# Patient Record
Sex: Female | Born: 1960 | Race: White | Hispanic: No | Smoking: Never smoker
Health system: Southern US, Community
[De-identification: ages and names within clinical notes are randomized; demographics above are authoritative.]

## PROBLEM LIST (undated history)

## (undated) DIAGNOSIS — K219 Gastro-esophageal reflux disease without esophagitis: Secondary | ICD-10-CM

## (undated) DIAGNOSIS — F319 Bipolar disorder, unspecified: Secondary | ICD-10-CM

## (undated) DIAGNOSIS — I10 Essential (primary) hypertension: Secondary | ICD-10-CM

## (undated) DIAGNOSIS — G43909 Migraine, unspecified, not intractable, without status migrainosus: Secondary | ICD-10-CM

## (undated) DIAGNOSIS — M1990 Unspecified osteoarthritis, unspecified site: Secondary | ICD-10-CM

---

## 2010-11-14 ENCOUNTER — Emergency Department (HOSPITAL_COMMUNITY): Payer: Self-pay

## 2010-11-14 ENCOUNTER — Emergency Department (HOSPITAL_COMMUNITY)
Admission: EM | Admit: 2010-11-14 | Discharge: 2010-11-14 | Disposition: A | Discharge status: Home / Self Care | Payer: Self-pay | Attending: Emergency Medicine | Admitting: Emergency Medicine

## 2010-11-14 DIAGNOSIS — R197 Diarrhea, unspecified: Secondary | ICD-10-CM | POA: Insufficient documentation

## 2010-11-14 DIAGNOSIS — R112 Nausea with vomiting, unspecified: Secondary | ICD-10-CM | POA: Insufficient documentation

## 2010-11-14 DIAGNOSIS — I10 Essential (primary) hypertension: Secondary | ICD-10-CM | POA: Insufficient documentation

## 2010-11-14 DIAGNOSIS — N83209 Unspecified ovarian cyst, unspecified side: Secondary | ICD-10-CM | POA: Insufficient documentation

## 2010-11-14 DIAGNOSIS — R109 Unspecified abdominal pain: Secondary | ICD-10-CM | POA: Insufficient documentation

## 2010-11-14 LAB — DIFFERENTIAL
Basophils Relative: 0 % (ref 0–1)
Eosinophils Absolute: 0.1 10*3/uL (ref 0.0–0.7)
Eosinophils Relative: 1 % (ref 0–5)
Monocytes Absolute: 0.7 10*3/uL (ref 0.1–1.0)
Monocytes Relative: 9 % (ref 3–12)
Neutro Abs: 5.6 10*3/uL (ref 1.7–7.7)

## 2010-11-14 LAB — URINALYSIS, ROUTINE W REFLEX MICROSCOPIC
Bilirubin Urine: NEGATIVE
Hgb urine dipstick: NEGATIVE
Nitrite: NEGATIVE
pH: 6 (ref 5.0–8.0)

## 2010-11-14 LAB — COMPREHENSIVE METABOLIC PANEL
ALT: 11 U/L (ref 0–35)
AST: 12 U/L (ref 0–37)
Alkaline Phosphatase: 68 U/L (ref 39–117)
CO2: 27 mEq/L (ref 19–32)
Chloride: 99 mEq/L (ref 96–112)
GFR calc Af Amer: >90 mL/min (ref >90)
GFR calc non Af Amer: >90 mL/min (ref >90)
Glucose, Bld: 103 mg/dL — ABNORMAL HIGH (ref 70–99)
Sodium: 133 mEq/L — ABNORMAL LOW (ref 135–145)
Total Bilirubin: 0.3 mg/dL (ref 0.3–1.2)

## 2010-11-14 LAB — OCCULT BLOOD, POC DEVICE: Fecal Occult Bld: NEGATIVE

## 2010-11-14 LAB — CBC
Hemoglobin: 10.4 g/dL — ABNORMAL LOW (ref 12.0–15.0)
MCH: 31.1 pg (ref 26.0–34.0)
MCHC: 34.1 g/dL (ref 30.0–36.0)
RDW: 13 % (ref 11.5–15.5)

## 2010-11-14 MED ORDER — IOHEXOL 300 MG/ML  SOLN
80.0000 mL | Freq: Once | INTRAMUSCULAR | Status: AC | PRN
  Administered 2010-11-14: 80 mL via INTRAVENOUS

## 2010-11-15 LAB — GIARDIA/CRYPTOSPORIDIUM SCREEN(EIA): Giardia Screen - EIA: NEGATIVE

## 2010-11-17 LAB — STOOL CULTURE

## 2010-11-19 ENCOUNTER — Emergency Department (HOSPITAL_COMMUNITY)
Admission: EM | Admit: 2010-11-19 | Discharge: 2010-11-19 | Disposition: A | Discharge status: Home / Self Care | Payer: Self-pay | Attending: Emergency Medicine | Admitting: Emergency Medicine

## 2010-11-19 DIAGNOSIS — R1032 Left lower quadrant pain: Secondary | ICD-10-CM | POA: Insufficient documentation

## 2010-11-19 DIAGNOSIS — I10 Essential (primary) hypertension: Secondary | ICD-10-CM | POA: Insufficient documentation

## 2010-11-19 DIAGNOSIS — N949 Unspecified condition associated with female genital organs and menstrual cycle: Secondary | ICD-10-CM | POA: Insufficient documentation

## 2010-11-19 DIAGNOSIS — N83209 Unspecified ovarian cyst, unspecified side: Secondary | ICD-10-CM | POA: Insufficient documentation

## 2010-11-19 LAB — DIFFERENTIAL
Basophils Relative: 0 % (ref 0–1)
Lymphs Abs: 1.3 10*3/uL (ref 0.7–4.0)
Monocytes Absolute: 0.6 10*3/uL (ref 0.1–1.0)
Monocytes Relative: 9 % (ref 3–12)
Neutro Abs: 4.6 10*3/uL (ref 1.7–7.7)
Neutrophils Relative %: 70 % (ref 43–77)

## 2010-11-19 LAB — CBC
HCT: 31.2 % — ABNORMAL LOW (ref 36.0–46.0)
Hemoglobin: 10.6 g/dL — ABNORMAL LOW (ref 12.0–15.0)
MCH: 31.2 pg (ref 26.0–34.0)
MCHC: 34 g/dL (ref 30.0–36.0)
MCV: 91.8 fL (ref 78.0–100.0)
RBC: 3.4 MIL/uL — ABNORMAL LOW (ref 3.87–5.11)

## 2010-12-10 ENCOUNTER — Encounter: Payer: Self-pay | Admitting: *Deleted

## 2010-12-10 ENCOUNTER — Emergency Department (HOSPITAL_COMMUNITY)
Admission: EM | Admit: 2010-12-10 | Discharge: 2010-12-10 | Disposition: A | Discharge status: Home / Self Care | Payer: Self-pay | Attending: Emergency Medicine | Admitting: Emergency Medicine

## 2010-12-10 DIAGNOSIS — M25569 Pain in unspecified knee: Secondary | ICD-10-CM | POA: Insufficient documentation

## 2010-12-10 DIAGNOSIS — M1712 Unilateral primary osteoarthritis, left knee: Secondary | ICD-10-CM

## 2010-12-10 DIAGNOSIS — F319 Bipolar disorder, unspecified: Secondary | ICD-10-CM | POA: Insufficient documentation

## 2010-12-10 DIAGNOSIS — M1710 Unilateral primary osteoarthritis, unspecified knee: Secondary | ICD-10-CM | POA: Insufficient documentation

## 2010-12-10 DIAGNOSIS — Z79899 Other long term (current) drug therapy: Secondary | ICD-10-CM | POA: Insufficient documentation

## 2010-12-10 DIAGNOSIS — I10 Essential (primary) hypertension: Secondary | ICD-10-CM | POA: Insufficient documentation

## 2010-12-10 HISTORY — DX: Essential (primary) hypertension: I10

## 2010-12-10 HISTORY — DX: Unspecified osteoarthritis, unspecified site: M19.90

## 2010-12-10 HISTORY — DX: Migraine, unspecified, not intractable, without status migrainosus: G43.909

## 2010-12-10 HISTORY — DX: Bipolar disorder, unspecified: F31.9

## 2010-12-10 MED ORDER — HYDROCODONE-ACETAMINOPHEN 5-325 MG PO TABS
1.0000 | ORAL_TABLET | Freq: Once | ORAL | Status: AC
  Administered 2010-12-10: 1 via ORAL
  Filled 2010-12-10: qty 1

## 2010-12-10 MED ORDER — MELOXICAM 7.5 MG PO TABS: 7.5000 mg | ORAL_TABLET | Freq: Every day | ORAL | Status: DC

## 2010-12-10 MED ORDER — HYDROCODONE-ACETAMINOPHEN 10-325 MG PO TABS: 1.0000 | ORAL_TABLET | Freq: Four times a day (QID) | ORAL | Status: DC | PRN

## 2010-12-10 MED ORDER — HYDROCODONE-ACETAMINOPHEN 5-325 MG PO TABS: 1.0000 | ORAL_TABLET | Freq: Once | ORAL | Status: DC

## 2010-12-10 NOTE — ED Notes (Signed)
Patient has had ongoing knee pain,  Dx as arthritis.  Patient uses a cane to ambulate but states it broke recently.  Patient denies any social issues.  [facility] is [facility].

## 2010-12-10 NOTE — ED Notes (Signed)
Pt reports left pain x several months, had steriod shot several months ago for the same. States pain is increasing and states she feels like her knee is swollen, reports need help with ambulation. Denies surgery to knee.

## 2010-12-10 NOTE — ED Provider Notes (Signed)
History     CSN: [id number] Arrival date & time: 12/10/2010  8:41 AM   None     Chief Complaint  Patient presents with  . Knee Pain    (Consider location/radiation/quality/duration/timing/severity/associated sxs/prior treatment) HPI History provided by patient.  Pt has been diagnosed w/ arthritis of L knee.  Had an orthopedist at [facility] but recently moved to [facility] and has no transportation to [employer]. Pain has gradually worsened over the past 2 months.  Aggravated by flexion and bearing weight.  No relief w/ ibuprofen.  Denies fever and recent injury.     Past Medical History  Diagnosis Date  . Hypertension   . Bipolar 1 disorder   . Arthritis   . Migraine headache     History reviewed. No pertinent past surgical history.  History reviewed. No pertinent family history.  History  Substance Use Topics  . Smoking status: Never Smoker   . Smokeless tobacco: Never Used  . Alcohol Use: Yes    OB History    Grav Para Term Preterm Abortions TAB SAB Ect Mult Living                  Review of Systems  All other systems reviewed and are negative.    Allergies  Penicillins and Sulfa antibiotics  Home Medications   Current Outpatient Rx  Name Route Sig Dispense Refill  . CLONIDINE HCL 0.1 MG PO TABS Oral Take 0.1 mg by mouth 2 (two) times daily.      Marland Kitchen FLUOXETINE HCL 20 MG PO CAPS Oral Take 20 mg by mouth daily.      Marland Kitchen HYDROCODONE-ACETAMINOPHEN 5-325 MG PO TABS Oral Take 1 tablet by mouth once. 20 tablet 0  . MELOXICAM 7.5 MG PO TABS Oral Take 1 tablet (7.5 mg total) by mouth daily. 30 tablet 0    BP 104/66  Pulse 90  Temp(Src) 97.4 F (36.3 C) (Oral)  Resp 16  SpO2 97%  Physical Exam  Nursing note and vitals reviewed. Constitutional: She is oriented to person, place, and time. She appears well-developed and well-nourished. No distress.  HENT:  Head: Normocephalic and atraumatic.  Eyes:       Normal appearance  Neck: Normal range of motion.  Musculoskeletal:       Left knee w/out obvious edema.  No skin changes.  Tenderness at medial/lateral joint line.  Pain w/ extreme flexion.  Nml ankle.  Distal NV intact.   Neurological: She is alert and oriented to person, place, and time.  Psychiatric: She has a normal mood and affect. Her behavior is normal.    ED Course  Procedures (including critical care time)  Labs Reviewed - No data to display No results found.   1. Arthritis of left knee       MDM  Pt presents w/ acute on chronic left knee arthritis.  No signs of joint infection on exam.  Pt ambulatory.  Received one vicodin in ED and prescription for same + mobic.  Ortho tech placed in knee sleeve.  Recommended pt buy a cane for additional support and f/u with her orthopedist in [place] if at all possible.  Referred to [facility] so that she can find a PCP in [place] also.        Otilio Miu, [place] 12/10/10 [phone]

## 2010-12-10 NOTE — Progress Notes (Signed)
Orthopedic Tech Progress Note Patient Details:  Alexandra Fitzgerald 04/17/1960 [id number]  Other Ortho Devices Type of Ortho Device: Knee Sleeve Ortho Device Location: applied to left knee   Gaye Pollack 12/10/2010, 10:22 AM

## 2010-12-13 NOTE — ED Provider Notes (Signed)
Medical screening examination/treatment/procedure(s) were performed by non-physician practitioner and as supervising physician I was immediately available for consultation/collaboration.  Raeford Razor, MD 12/13/10 [phone]

## 2010-12-16 ENCOUNTER — Emergency Department (HOSPITAL_COMMUNITY)
Admission: EM | Admit: 2010-12-16 | Discharge: 2010-12-16 | Disposition: A | Discharge status: Home / Self Care | Payer: Self-pay | Attending: Emergency Medicine | Admitting: Emergency Medicine

## 2010-12-16 ENCOUNTER — Emergency Department (HOSPITAL_COMMUNITY): Payer: Self-pay

## 2010-12-16 ENCOUNTER — Encounter (HOSPITAL_COMMUNITY): Payer: Self-pay | Admitting: Nurse Practitioner

## 2010-12-16 DIAGNOSIS — M129 Arthropathy, unspecified: Secondary | ICD-10-CM | POA: Insufficient documentation

## 2010-12-16 DIAGNOSIS — Z79899 Other long term (current) drug therapy: Secondary | ICD-10-CM | POA: Insufficient documentation

## 2010-12-16 DIAGNOSIS — R112 Nausea with vomiting, unspecified: Secondary | ICD-10-CM | POA: Insufficient documentation

## 2010-12-16 DIAGNOSIS — F319 Bipolar disorder, unspecified: Secondary | ICD-10-CM | POA: Insufficient documentation

## 2010-12-16 DIAGNOSIS — N739 Female pelvic inflammatory disease, unspecified: Secondary | ICD-10-CM | POA: Insufficient documentation

## 2010-12-16 DIAGNOSIS — I10 Essential (primary) hypertension: Secondary | ICD-10-CM | POA: Insufficient documentation

## 2010-12-16 DIAGNOSIS — R197 Diarrhea, unspecified: Secondary | ICD-10-CM | POA: Insufficient documentation

## 2010-12-16 DIAGNOSIS — N730 Acute parametritis and pelvic cellulitis: Secondary | ICD-10-CM

## 2010-12-16 DIAGNOSIS — R1032 Left lower quadrant pain: Secondary | ICD-10-CM | POA: Insufficient documentation

## 2010-12-16 DIAGNOSIS — N898 Other specified noninflammatory disorders of vagina: Secondary | ICD-10-CM | POA: Insufficient documentation

## 2010-12-16 LAB — URINE MICROSCOPIC-ADD ON

## 2010-12-16 LAB — DIFFERENTIAL
Basophils Absolute: 0 10*3/uL (ref 0.0–0.1)
Basophils Relative: 0 % (ref 0–1)
Eosinophils Absolute: 0.1 10*3/uL (ref 0.0–0.7)
Eosinophils Relative: 1 % (ref 0–5)
Monocytes Absolute: 0.8 10*3/uL (ref 0.1–1.0)

## 2010-12-16 LAB — URINALYSIS, ROUTINE W REFLEX MICROSCOPIC
Bilirubin Urine: NEGATIVE
Nitrite: NEGATIVE
Specific Gravity, Urine: 1.009 (ref 1.005–1.030)
Urobilinogen, UA: 0.2 mg/dL (ref 0.0–1.0)
pH: 6.5 (ref 5.0–8.0)

## 2010-12-16 LAB — CBC
HCT: 34.2 % — ABNORMAL LOW (ref 36.0–46.0)
MCH: 30.7 pg (ref 26.0–34.0)
MCHC: 33.3 g/dL (ref 30.0–36.0)
MCV: 92.2 fL (ref 78.0–100.0)
Platelets: 245 10*3/uL (ref 150–400)
RDW: 12.7 % (ref 11.5–15.5)
WBC: 12.9 10*3/uL — ABNORMAL HIGH (ref 4.0–10.5)

## 2010-12-16 LAB — WET PREP, GENITAL
Trich, Wet Prep: NONE SEEN
Yeast Wet Prep HPF POC: NONE SEEN

## 2010-12-16 LAB — COMPREHENSIVE METABOLIC PANEL
ALT: 7 U/L (ref 0–35)
AST: 9 U/L (ref 0–37)
CO2: 29 mEq/L (ref 19–32)
Calcium: 9.5 mg/dL (ref 8.4–10.5)
Creatinine, Ser: 0.6 mg/dL (ref 0.50–1.10)
GFR calc Af Amer: >90 mL/min (ref >90)
GFR calc non Af Amer: >90 mL/min (ref >90)
Sodium: 137 mEq/L (ref 135–145)
Total Protein: 6.6 g/dL (ref 6.0–8.3)

## 2010-12-16 MED ORDER — CEFTRIAXONE SODIUM 250 MG IJ SOLR: 250.0000 mg | Freq: Once | INTRAMUSCULAR | Status: DC

## 2010-12-16 MED ORDER — DOXYCYCLINE HYCLATE 100 MG PO TABS
ORAL_TABLET | ORAL | Status: AC
  Administered 2010-12-16: 100 mg
  Filled 2010-12-16: qty 1

## 2010-12-16 MED ORDER — METRONIDAZOLE 500 MG PO TABS
ORAL_TABLET | ORAL | Status: AC
  Administered 2010-12-16: 12:00:00 via ORAL
  Filled 2010-12-16: qty 1

## 2010-12-16 MED ORDER — ACETAMINOPHEN 325 MG PO TABS
650.0000 mg | ORAL_TABLET | Freq: Once | ORAL | Status: AC
  Administered 2010-12-16: 650 mg via ORAL

## 2010-12-16 MED ORDER — ACETAMINOPHEN 325 MG PO TABS
ORAL_TABLET | ORAL | Status: AC
  Filled 2010-12-16: qty 2

## 2010-12-16 MED ORDER — DOXYCYCLINE HYCLATE 100 MG PO TABS: 100.0000 mg | ORAL_TABLET | Freq: Two times a day (BID) | ORAL | Status: DC

## 2010-12-16 MED ORDER — CEFTRIAXONE SODIUM 250 MG IJ SOLR
INTRAMUSCULAR | Status: AC
  Filled 2010-12-16: qty 250

## 2010-12-16 MED ORDER — DOXYCYCLINE HYCLATE 100 MG PO TABS: 100.0000 mg | ORAL_TABLET | Freq: Once | ORAL | Status: DC

## 2010-12-16 MED ORDER — LIDOCAINE HCL (PF) 1 % IJ SOLN: 2.0000 mL | Freq: Once | INTRAMUSCULAR | Status: DC

## 2010-12-16 MED ORDER — OXYCODONE-ACETAMINOPHEN 5-325 MG PO TABS
2.0000 | ORAL_TABLET | Freq: Once | ORAL | Status: AC
  Administered 2010-12-16: 2 via ORAL
  Filled 2010-12-16: qty 2

## 2010-12-16 MED ORDER — METRONIDAZOLE 500 MG PO TABS: 500.0000 mg | ORAL_TABLET | Freq: Once | ORAL | Status: DC

## 2010-12-16 MED ORDER — CEFTRIAXONE SODIUM 250 MG IJ SOLR
250.0000 mg | Freq: Once | INTRAMUSCULAR | Status: AC
  Administered 2010-12-16: 250 mg via INTRAMUSCULAR

## 2010-12-16 MED ORDER — METRONIDAZOLE 500 MG PO TABS: 500.0000 mg | ORAL_TABLET | Freq: Two times a day (BID) | ORAL | Status: DC

## 2010-12-16 MED ORDER — ONDANSETRON 4 MG PO TBDP
4.0000 mg | ORAL_TABLET | Freq: Once | ORAL | Status: AC
  Administered 2010-12-16: 4 mg via ORAL
  Filled 2010-12-16: qty 1

## 2010-12-16 NOTE — ED Notes (Signed)
Pt c/o L pelvic/abd pain onset yesterday. Has been diagnosed with ovarian cyst 1 month ago and states the pain is getting worse since. Pt reports decreased appetite and nausea/vomiting

## 2010-12-16 NOTE — Progress Notes (Signed)
Paged by attending staff to meet with patient and discuss her medication needs. Patient is homeless, staying at [facility], waiting on [facility] for health assistance. I have contacted [facility] Brayton Caves or Dahlia Client who will come meet with patient. Patient is unable to afford the two antibiotics the attending physician has ordered for her. Per pharmacy, patient is eligible for the Z[employer] fund. I have approved the medications and tubed them to the pharmacy, confirming the delivery and for pharmacy to contact unit secretary in ED.

## 2010-12-16 NOTE — ED Provider Notes (Signed)
History     CSN: [id number] Arrival date & time: 12/16/2010  8:18 AM   First MD Initiated Contact with Patient 12/16/10 [phone]      Chief Complaint  Patient presents with  . Abdominal Pain  . Ovarian Cyst  . Nausea    (Consider location/radiation/quality/duration/timing/severity/associated sxs/prior treatment) HPI This is 50 year old female with PMH of HTN and Bipolar disorder who present to the ED with abd pain. The history is provided by pt and her husband. Pt reports that she has had constant abdominal pain for one month,  She came to ED for evaluation and found to have nonobstructive left kidnet stone, left diverticulosis,and  left side 2.5 cm ovarian cyst by abd CT on 11/14/10. Pt was discharged home with pain medications. Pt states that the ultram did not help her much and she did not get any Vicodin due to financial strain.   Pt reports that her abdominal pain becomes worse for past two days, accompanied with nausea and vomiting x 1 this am, stomach content w/o blood. Her abdominal pain is mainly located at LLQ, radiating to lower abdominal area, sharp pain, moderate to severe, constant. Position changes and standing up make it worse and no alleviating factors. Pt also reports watery stools x 2 yesterday morning with no bloody stools noted.   Past Medical History  Diagnosis Date  . Hypertension   . Bipolar 1 disorder   . Arthritis   . Migraine headache     History reviewed. No pertinent past surgical history.  History reviewed. No pertinent family history.  History  Substance Use Topics  . Smoking status: Never Smoker   . Smokeless tobacco: Never Used  . Alcohol Use: Yes    OB History    Grav Para Term Preterm Abortions TAB SAB Ect Mult Living                LMP 11/07 History of chlamydia infection in 2012 History of trichomonal infection in 2009 Lives with her husband over 1.5 years now. No means of contraception.   Review of Systems No headache, fever, or  sore throat. No shortness of breath or dyspnea on exertion. No chest pain, chest pressure or palpitation.  No melena or incontinence.  Denies dysuria, urinary frequency or urgency. No muscle weakness.                   Recent decreased appetite due to abdominal pain.  Allergies  Penicillins and Sulfa antibiotics  Home Medications   Current Outpatient Rx  Name Route Sig Dispense Refill  . CLONIDINE HCL 0.1 MG PO TABS Oral Take 0.1 mg by mouth 2 (two) times daily.      Marland Kitchen FLUOXETINE HCL 20 MG PO CAPS Oral Take 20 mg by mouth daily.      . TRAMADOL HCL 50 MG PO TABS Oral Take 50 mg by mouth every 6 (six) hours as needed. Maximum dose= 8 tablets per day For pain       BP 96/69  Pulse 87  Temp(Src) 97.4 F (36.3 C) (Oral)  Resp 16  SpO2 96%  Physical Exam General: crying due to abdominal pain. alert, well-developed, and cooperative to examination.  Head: normocephalic and atraumatic.  Eyes: vision grossly intact, pupils equal, pupils round, pupils reactive to light, no injection and anicteric.  Mouth: pharynx pink and moist, no erythema, and no exudates.  Neck: supple, full ROM, no thyromegaly, no JVD, and no carotid bruits.  Lungs: normal respiratory effort, no accessory  muscle use, normal breath sounds, no crackles, and no wheezes. Heart: normal rate, regular rhythm, no murmur, no gallop, and no rub.  Abdomen: soft, normal bowel sounds, no distention,no hepatomegaly, and no splenomegaly.  LLQ tenderness noted. no guarding, no rebound tenderness, Msk: no joint swelling, no joint warmth, and no redness over joints.  Pulses: 2+ DP/PT pulses bilaterally Extremities: No cyanosis, clubbing, edema Neurologic: alert & oriented X3, cranial nerves II-XII intact, strength normal in all extremities, sensation intact to light touch, and gait normal.  Skin: turgor normal and no rashes.  Psych: Oriented X3, memory intact for recent and remote, normally interactive, good eye contact, not anxious  appearing, and not depressed appearing.  GU: normal external genitalia, normal-appearing vaginal vault, small amount bloody, brownish vaginal discharge with foul odor noted.  Cervix grossly intact without any visible lesions or bleeding. bimanual exam done with left LLQ tenderness noted. Unable to appreciate adnexal structures due to central obesity.   ED Course  Procedures (including critical care time) 10:31 AM The clinical manifestation for her LLQ abdominal pain could have the following DDX, including PID, Tubal ovarian abscess, ovarian cyst rupture, or diverticulitis.  - Will check her CBC, CMP, Pregnancy test, pelvic ultrasound -pelvic exam is done and GC/Chlamydia and wet prep sent. - will arrange the follow up visit with  physician for women health.   10:52 AM Wet prep is positive for clue cells. Give her history of chlamydia in 2012, Will cover GC/Chlamydia/Trich as well. - Ceftriaxone 250 mg IM x 1 dose -doxycycline 100 mg po x 1 dose -flagyl 500 mg po x 1 dose   Results for orders placed during the hospital encounter of 12/16/10  CBC      Component Value Range   WBC 12.9 (*) 4.0 - 10.5 (K/uL)   RBC 3.71 (*) 3.87 - 5.11 (MIL/uL)   Hemoglobin 11.4 (*) 12.0 - 15.0 (g/dL)   HCT 04.5 (*) 40.9 - 46.0 (%)   MCV 92.2  78.0 - 100.0 (fL)   MCH 30.7  26.0 - 34.0 (pg)   MCHC 33.3  30.0 - 36.0 (g/dL)   RDW 81.1  91.4 - 78.2 (%)   Platelets 245  150 - 400 (K/uL)  DIFFERENTIAL      Component Value Range   Neutrophils Relative 84 (*) 43 - 77 (%)   Neutro Abs 10.8 (*) 1.7 - 7.7 (K/uL)   Lymphocytes Relative 9 (*) 12 - 46 (%)   Lymphs Abs 1.1  0.7 - 4.0 (K/uL)   Monocytes Relative 7  3 - 12 (%)   Monocytes Absolute 0.8  0.1 - 1.0 (K/uL)   Eosinophils Relative 1  0 - 5 (%)   Eosinophils Absolute 0.1  0.0 - 0.7 (K/uL)   Basophils Relative 0  0 - 1 (%)   Basophils Absolute 0.0  0.0 - 0.1 (K/uL)  COMPREHENSIVE METABOLIC PANEL      Component Value Range   Sodium 137  135 - 145  (mEq/L)   Potassium 3.8  3.5 - 5.1 (mEq/L)   Chloride 101  96 - 112 (mEq/L)   CO2 29  19 - 32 (mEq/L)   Glucose, Bld 86  70 - 99 (mg/dL)   BUN 8  6 - 23 (mg/dL)   Creatinine, Ser 9.56  0.50 - 1.10 (mg/dL)   Calcium 9.5  8.4 - 21.3 (mg/dL)   Total Protein 6.6  6.0 - 8.3 (g/dL)   Albumin 3.3 (*) 3.5 - 5.2 (g/dL)   AST 9  0 - 37 (U/L)   ALT 7  0 - 35 (U/L)   Alkaline Phosphatase 64  39 - 117 (U/L)   Total Bilirubin 0.7  0.3 - 1.2 (mg/dL)   GFR calc non Af Amer >90  >90 (mL/min)   GFR calc Af Amer >90  >90 (mL/min)  URINALYSIS, ROUTINE W REFLEX MICROSCOPIC      Component Value Range   Color, Urine YELLOW  YELLOW    Appearance CLOUDY (*) CLEAR    Specific Gravity, Urine 1.009  1.005 - 1.030    pH 6.5  5.0 - 8.0    Glucose, UA NEGATIVE  NEGATIVE (mg/dL)   Hgb urine dipstick LARGE (*) NEGATIVE    Bilirubin Urine NEGATIVE  NEGATIVE    Ketones, ur NEGATIVE  NEGATIVE (mg/dL)   Protein, ur NEGATIVE  NEGATIVE (mg/dL)   Urobilinogen, UA 0.2  0.0 - 1.0 (mg/dL)   Nitrite NEGATIVE  NEGATIVE    Leukocytes, UA SMALL (*) NEGATIVE   WET PREP, GENITAL      Component Value Range   Yeast, Wet Prep NONE SEEN  NONE SEEN    Trich, Wet Prep NONE SEEN  NONE SEEN    Clue Cells, Wet Prep MODERATE (*) NONE SEEN    WBC, Wet Prep HPF POC FEW (*) NONE SEEN   URINE MICROSCOPIC-ADD ON      Component Value Range   Squamous Epithelial / LPF FEW (*) RARE    WBC, UA 3-6  <3 (WBC/hpf)   RBC / HPF 11-20  <3 (RBC/hpf)   Bacteria, UA FEW (*) RARE    US Transvaginal Non-ob  12/16/2010  *RADIOLOGY REPORT*  Clinical Data: Left pelvic pain. Left ovarian cyst seen on recent CT.  TRANSABDOMINAL AND TRANSVAGINAL ULTRASOUND OF PELVIS  Technique:  Both transabdominal and transvaginal ultrasound examinations of the pelvis were performed.  Transabdominal technique was performed for global imaging of the pelvis including uterus, ovaries, adnexal regions, and pelvic cul-de-sac.  It was necessary to proceed with endovaginal exam  following the transabdominal exam to visualize the ovaries and adnexa.  Comparison:  CT on 11/14/2010.  Findings: Uterus:  6.8 x 3.5 x 4.8 cm.  No evidence of fibroids or other significant abnormality.  Endometrium: Measures 4 mm in thickness transvaginally.  No focal lesion identified.  Right ovary: 2.1 x 1.3 x 2.1 cm.  Normal appearance.  Left ovary: 3.1 x 2.2 x 2.4 cm.  Normal appearance.  Other Findings:  No free fluid  IMPRESSION: No evidence of pelvic mass or other significant abnormality.  Left ovarian cyst seen on prior CT is no longer visualized.  Original Report Authenticated By: Danae Orleans, M.D.   US Pelvis Complete  12/16/2010  *RADIOLOGY REPORT*  Clinical Data: Left pelvic pain. Left ovarian cyst seen on recent CT.  TRANSABDOMINAL AND TRANSVAGINAL ULTRASOUND OF PELVIS  Technique:  Both transabdominal and transvaginal ultrasound examinations of the pelvis were performed.  Transabdominal technique was performed for global imaging of the pelvis including uterus, ovaries, adnexal regions, and pelvic cul-de-sac.  It was necessary to proceed with endovaginal exam following the transabdominal exam to visualize the ovaries and adnexa.  Comparison:  CT on 11/14/2010.  Findings: Uterus:  6.8 x 3.5 x 4.8 cm.  No evidence of fibroids or other significant abnormality.  Endometrium: Measures 4 mm in thickness transvaginally.  No focal lesion identified.  Right ovary: 2.1 x 1.3 x 2.1 cm.  Normal appearance.  Left ovary: 3.1 x 2.2 x 2.4 cm.  Normal  appearance.  Other Findings:  No free fluid  IMPRESSION: No evidence of pelvic mass or other significant abnormality.  Left ovarian cyst seen on prior CT is no longer visualized.  Original Report Authenticated By: Danae Orleans, M.D.   Medical screening examination/treatment/procedure(s) were conducted as a shared visit with resident physician.  I personally evaluated the patient during the encounter and confirmed recorded physical exam findings.  I agree with  documentation and treatment plan.  Cyndra Numbers, MD Emergency Medicine Attending Physician      Dede Query, MD 12/16/10 1037  Dede Query, MD 12/16/10 1054  Dede Query, MD 12/16/10 1119  Dede Query, MD 12/16/10 1233  Cyndra Numbers, MD 12/16/10 2125

## 2010-12-16 NOTE — ED Notes (Signed)
Pt notified of delay in discharge, Case Management notified re: 2 x RX, in house pharmacy filling prescription

## 2010-12-26 ENCOUNTER — Emergency Department (HOSPITAL_COMMUNITY)
Admission: EM | Admit: 2010-12-26 | Discharge: 2010-12-26 | Disposition: A | Discharge status: Home / Self Care | Payer: Self-pay | Attending: Emergency Medicine | Admitting: Emergency Medicine

## 2010-12-26 DIAGNOSIS — F319 Bipolar disorder, unspecified: Secondary | ICD-10-CM | POA: Insufficient documentation

## 2010-12-26 DIAGNOSIS — I10 Essential (primary) hypertension: Secondary | ICD-10-CM | POA: Insufficient documentation

## 2010-12-26 DIAGNOSIS — R112 Nausea with vomiting, unspecified: Secondary | ICD-10-CM | POA: Insufficient documentation

## 2010-12-26 DIAGNOSIS — R109 Unspecified abdominal pain: Secondary | ICD-10-CM | POA: Insufficient documentation

## 2010-12-26 DIAGNOSIS — B3731 Acute candidiasis of vulva and vagina: Secondary | ICD-10-CM | POA: Insufficient documentation

## 2010-12-26 DIAGNOSIS — B373 Candidiasis of vulva and vagina: Secondary | ICD-10-CM

## 2010-12-26 DIAGNOSIS — N898 Other specified noninflammatory disorders of vagina: Secondary | ICD-10-CM | POA: Insufficient documentation

## 2010-12-26 LAB — WET PREP, GENITAL
Clue Cells Wet Prep HPF POC: NONE SEEN
Trich, Wet Prep: NONE SEEN

## 2010-12-26 LAB — URINALYSIS, ROUTINE W REFLEX MICROSCOPIC
Glucose, UA: NEGATIVE mg/dL
Protein, ur: NEGATIVE mg/dL
Specific Gravity, Urine: 1.018 (ref 1.005–1.030)
pH: 7 (ref 5.0–8.0)

## 2010-12-26 LAB — URINE MICROSCOPIC-ADD ON

## 2010-12-26 MED ORDER — KETOROLAC TROMETHAMINE 60 MG/2ML IM SOLN
60.0000 mg | Freq: Once | INTRAMUSCULAR | Status: AC
  Administered 2010-12-26: 60 mg via INTRAMUSCULAR
  Filled 2010-12-26: qty 2

## 2010-12-26 MED ORDER — FLUCONAZOLE 150 MG PO TABS
150.0000 mg | ORAL_TABLET | ORAL | Status: AC
  Administered 2010-12-26: 150 mg via ORAL
  Filled 2010-12-26 (×2): qty 1

## 2010-12-26 NOTE — ED Notes (Signed)
Pt brought back from triage; undressed, in gown, on continuous pulse oximetry and blood pressure cuff; family at bedside; blanket given

## 2010-12-26 NOTE — ED Notes (Signed)
Peri. Umbilical pain - worse since 12/16/2010. Pt. Dx. With infection in the vaginal area. Pt. Taking doxycycline and flagyl but stop taking it b/c she developed redness in the face, which she thought was sunburn. Cont. To have periumbilical abd. Pain and Infant Zink vaginal d/c.

## 2010-12-26 NOTE — ED Provider Notes (Cosign Needed)
History     CSN: [id number] Arrival date & time: 12/26/2010  8:33 AM   First MD Initiated Contact with Patient 12/26/10 0840      Chief Complaint  Patient presents with  . Abdominal Pain    (Consider location/radiation/quality/duration/timing/severity/associated sxs/prior treatment) HPI  Patient relates they moved to [place] from [place] about 2 months ago. This is her fifth ED visit and the third for abdominal pain. She relates she's had lower abdominal  pain for about a month with itching and burning in her groin. Her pain is in her lower abdomen and indicates all the way across from left to right. She did have nausea and vomiting earlier but not now. She denies any diarrhea. She states she started out with clear vaginal  discharge. She was seen on October 14 and had a CT of her abdomen and pelvis which was basically normal with a possible left ovarian cyst. She was seen again on November 15 and had a pelvic ultrasound done which showed the cyst was now gone and was basically benign study. Her pelvic exam revealed clue cells.  Her GC and Chlamydia were negative. She was treated with doxycycline and Flagyl and was given a 14 day supply of each supplied by the hospital. She has her pill bottles with her and she only took the doxycycline for 7 days and she still has 9 pills left of the Flagyl. She relates her husband was not treated and they have had sex since she started treatment. She relates her pain is made worse when she coughs or bends over and it feels better if she lays flat. She denies chest pain shortness of breath coughing sore throat fever. She states she had chills 2 days ago but did not check her temp. She states she stopped antibiotics as her face was getting red and warm to touch.  Primary care physician she has an appointment tomorrow at 11 AM with Dr. Lula Olszewski in the outpatient clinics is her first appointment      Past Medical History  Diagnosis Date  .  Hypertension   . Bipolar 1 disorder   . Arthritis   . Migraine headache    chronic left knee pain  No past surgical history on file. 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  History  Substance Use Topics  . Smoking status: Never Smoker   . Smokeless tobacco: Never Used  . Alcohol Use: Yes   lives with husband States she's applying for disability for her left knee Lives in a shelter  OB History    Grav Para Term Preterm Abortions TAB SAB Ect Mult Living                  Review of Systems  All other systems reviewed and are negative.    Allergies  Doxycycline; Flagyl; Penicillins; Shellfish allergy; and Sulfa antibiotics  Home Medications   Current Outpatient Rx  Name Route Sig Dispense Refill  . CLONIDINE HCL 0.1 MG PO TABS Oral Take 0.025-0.05 mg by mouth 3 (three) times daily. For anxiety and/or insomnia    . FLUOXETINE HCL 20 MG PO CAPS Oral Take 20 mg by mouth daily.       BP 104/58  Pulse 76  Temp(Src) 98 F (36.7 C) (Oral)  Resp 16  Ht 5\' 4"  (1.626 m)  Wt 169 lb (76.658 kg)  BMI 29.01 kg/m2  SpO2 98%  Vital signs normal  Physical Exam  Vitals  reviewed. Constitutional: She is oriented to person, place, and time. She appears well-developed and well-nourished.  HENT:  Head: Normocephalic and atraumatic.  Right Ear: External ear normal.  Left Ear: External ear normal.  Nose: Nose normal.  Mouth/Throat: Oropharynx is clear and moist.  Eyes: Conjunctivae and EOM are normal. Pupils are equal, round, and reactive to light.  Neck: Normal range of motion.  Cardiovascular: Normal rate, regular rhythm and normal heart sounds.   Pulmonary/Chest: Effort normal and breath sounds normal.  Abdominal: Soft. Bowel sounds are normal.       She has mild tenderness diffusely over lower abdomen there is no guarding or rebound  Genitourinary:       Patient's noted to have diffuse redness and minor swelling of her external genitalia. She  has minimal white discharge. She is a mild diffuse tenderness to palpation of her uterus and her ovaries however they are not palpable because of the thickness of her abdominal wall. Patient denies any change in soaps or detergents she denies use of feminine hygiene products or bubble baths.  Musculoskeletal: Normal range of motion.  Neurological: She is oriented to person, place, and time.  Skin: Skin is warm and dry.       Face is flushed  Psychiatric: She has a normal mood and affect. Her behavior is normal. Thought content normal.    ED Course  Procedures (including critical care time)  Results for orders placed during the hospital encounter of 12/26/10  WET PREP, GENITAL      Component Value Range   Yeast, Wet Prep FEW (*) NONE SEEN    Trich, Wet Prep NONE SEEN  NONE SEEN    Clue Cells, Wet Prep NONE SEEN  NONE SEEN    WBC, Wet Prep HPF POC FEW (*) NONE SEEN   PREGNANCY, URINE      Component Value Range   Preg Test, Ur NEGATIVE    URINALYSIS, ROUTINE W REFLEX MICROSCOPIC      Component Value Range   Color, Urine YELLOW  YELLOW    Appearance CLOUDY (*) CLEAR    Specific Gravity, Urine 1.018  1.005 - 1.030    pH 7.0  5.0 - 8.0    Glucose, UA NEGATIVE  NEGATIVE (mg/dL)   Hgb urine dipstick NEGATIVE  NEGATIVE    Bilirubin Urine NEGATIVE  NEGATIVE    Ketones, ur NEGATIVE  NEGATIVE (mg/dL)   Protein, ur NEGATIVE  NEGATIVE (mg/dL)   Urobilinogen, UA 0.2  0.0 - 1.0 (mg/dL)   Nitrite NEGATIVE  NEGATIVE    Leukocytes, UA SMALL (*) NEGATIVE   URINE MICROSCOPIC-ADD ON      Component Value Range   Squamous Epithelial / LPF MANY (*) RARE    WBC, UA 0-2  <3 (WBC/hpf)   Urine-Other RARE YEAST     US Transvaginal Non-ob  12/16/2010  *RADIOLOGY REPORT*  Clinical Data: Left pelvic pain. Left ovarian cyst seen on recent CT.  TRANSABDOMINAL AND TRANSVAGINAL ULTRASOUND OF PELVIS  Technique:  Both transabdominal and transvaginal ultrasound examinations of the pelvis were performed.   Transabdominal technique was performed for global imaging of the pelvis including uterus, ovaries, adnexal regions, and pelvic cul-de-sac.  It was necessary to proceed with endovaginal exam following the transabdominal exam to visualize the ovaries and adnexa.  Comparison:  CT on 11/14/2010.  Findings: Uterus:  6.8 x 3.5 x 4.8 cm.  No evidence of fibroids or other significant abnormality.  Endometrium: Measures 4 mm in thickness transvaginally.  No  focal lesion identified.  Right ovary: 2.1 x 1.3 x 2.1 cm.  Normal appearance.  Left ovary: 3.1 x 2.2 x 2.4 cm.  Normal appearance.  Other Findings:  No free fluid  IMPRESSION: No evidence of pelvic mass or other significant abnormality.  Left ovarian cyst seen on prior CT is no longer visualized.  Original Report Authenticated By: Danae Orleans, M.D.   US Pelvis Complete  12/16/2010  *RADIOLOGY REPORT*  Clinical Data: Left pelvic pain. Left ovarian cyst seen on recent CT.  TRANSABDOMINAL AND TRANSVAGINAL ULTRASOUND OF PELVIS  Technique:  Both transabdominal and transvaginal ultrasound examinations of the pelvis were performed.  Transabdominal technique was performed for global imaging of the pelvis including uterus, ovaries, adnexal regions, and pelvic cul-de-sac.  It was necessary to proceed with endovaginal exam following the transabdominal exam to visualize the ovaries and adnexa.  Comparison:  CT on 11/14/2010.  Findings: Uterus:  6.8 x 3.5 x 4.8 cm.  No evidence of fibroids or other significant abnormality.  Endometrium: Measures 4 mm in thickness transvaginally.  No focal lesion identified.  Right ovary: 2.1 x 1.3 x 2.1 cm.  Normal appearance.  Left ovary: 3.1 x 2.2 x 2.4 cm.  Normal appearance.  Other Findings:  No free fluid  IMPRESSION: No evidence of pelvic mass or other significant abnormality.  Left ovarian cyst seen on prior CT is no longer visualized.  Original Report Authenticated By: Danae Orleans, M.D.       Diagnoses that have been ruled out:    Diagnoses that are still under consideration:  Final diagnoses:  Monilial vaginitis    Medications  fluconazole (DIFLUCAN) tablet 150 mg (not administered)  ketorolac (TORADOL) injection 60 mg (60 mg Intramuscular Given 12/26/10 1136)   Plan discharge, keep appt tomorrow with PCP   Devoria Albe, MD, FACEP   MDM          Ward Givens, MD 12/26/10 1534

## 2010-12-27 ENCOUNTER — Ambulatory Visit: Payer: Self-pay | Admitting: Family Medicine

## 2011-01-13 ENCOUNTER — Encounter: Payer: Self-pay | Admitting: Family Medicine

## 2011-01-13 ENCOUNTER — Ambulatory Visit (INDEPENDENT_AMBULATORY_CARE_PROVIDER_SITE_OTHER): Payer: Self-pay | Admitting: Family Medicine

## 2011-01-13 DIAGNOSIS — M25562 Pain in left knee: Secondary | ICD-10-CM | POA: Insufficient documentation

## 2011-01-13 DIAGNOSIS — M25569 Pain in unspecified knee: Secondary | ICD-10-CM

## 2011-01-13 DIAGNOSIS — I10 Essential (primary) hypertension: Secondary | ICD-10-CM | POA: Insufficient documentation

## 2011-01-13 DIAGNOSIS — F319 Bipolar disorder, unspecified: Secondary | ICD-10-CM

## 2011-01-13 MED ORDER — NAPROXEN 500 MG PO TBEC: 500.0000 mg | DELAYED_RELEASE_TABLET | Freq: Two times a day (BID) | ORAL | Status: DC

## 2011-01-13 NOTE — Assessment & Plan Note (Signed)
Well controlled on Clonidine.  She denies any difficulty taking medication.

## 2011-01-13 NOTE — Assessment & Plan Note (Signed)
Pt taking prozac and clonidine through [facility] for Bipolar disorder.  This seems well controlled at this time, and clonidine also controlling BP.  Will not make any changes.

## 2011-01-13 NOTE — Progress Notes (Signed)
  Subjective:    Patient ID: Alexandra Fitzgerald, female    DOB: March 07, 1960, 50 y.o.   MRN: [id number]  HPI  Patient comes in to establish care. She used to live in [place], but has recently moved to [place], and is living with her boyfriend here. She has no health insurance, but is planning to apply further [facility] card through [employer].  The patient has bipolar disorder, and is seen at the [facility] the [place]. She takes Prozac. She also has high blood pressure, and takes clonidine, however she thought the clonidine was for her bipolar. She says it she has not had any depressed mood, or any racing thoughts or other manic symptoms recently.  The patient says that she has had arthritis in her left knee for while. She says that over the past year  her knee pain has been getting worse. She says that 2 or 3 months ago she had her fluid drawn off of her left knee, and a steroid injection. She says this only helped for about a month. The patient says she walks a lot, and that her knee is painful and swollen after she has been on her feet for long time. She denies any ear erythema or warmth to the touch when it is swollen.  Review of Systems Pertinent items noted in history of present illness.    Objective:   Physical Exam BP 120/79  Pulse 75  Temp(Src) 97.9 F (36.6 C) (Oral)  Ht 5\' 3"  (1.6 m)  Wt 165 lb (74.844 kg)  BMI 29.23 kg/m2  LMP 12/21/2010 General appearance: alert, cooperative and no distress Eyes: PERRL, EOMIT Ears: normal TM's and external ear canals both ears Nose: Nares normal. Septum midline. Mucosa normal. No drainage or sinus tenderness. Throat: lips, mucosa, and tongue normal; teeth and gums normal Neck: no adenopathy, supple, symmetrical, trachea midline and thyroid not enlarged, symmetric, no tenderness/mass/nodules Lungs: clear to auscultation bilaterally Heart: regular rate and rhythm, S1, S2 normal, no murmur, click, rub or gallop Extremities:  extremities normal, atraumatic, no cyanosis or edema Pulses: 2+ and symmetric MSK:  Left Knee: pt with joint line tenderness, crepitus with ROM, full ROM, normal ligamentous exam. Right knee: No joint line tenderness, mild crepitus, Normal ROM normal ligamentous exam.        Assessment & Plan:

## 2011-01-13 NOTE — Patient Instructions (Signed)
It was nice to meet you.  I am glad your stomach is feeling better.  I am sorry you knee is hurting so badly.  I want you to start taking Naproxen twice a day for your knee pain.  Remember you should always take it with food, and do not take it with ibuprofen (advil, motrin), or with aleve.  You can however take Tylenol as needed for pain with the Naproxen.   Please come back and see me for a Well Woman Exam when you have the [facility] card, we can discuss you knee pain again and make sure you are up to date on your Health Maintenance.

## 2011-01-13 NOTE — Assessment & Plan Note (Signed)
Will start naproxen scheduled and tylenol PRN for pain.  Will defer steroid injection as it did not help recently.  Will plan on referral to PT when pt has [facility] card.

## 2011-02-16 ENCOUNTER — Emergency Department (HOSPITAL_COMMUNITY): Payer: Self-pay

## 2011-02-16 ENCOUNTER — Encounter (HOSPITAL_COMMUNITY): Payer: Self-pay | Admitting: *Deleted

## 2011-02-16 ENCOUNTER — Emergency Department (HOSPITAL_COMMUNITY)
Admission: EM | Admit: 2011-02-16 | Discharge: 2011-02-16 | Disposition: A | Discharge status: Home / Self Care | Payer: Self-pay | Attending: Emergency Medicine | Admitting: Emergency Medicine

## 2011-02-16 DIAGNOSIS — M25569 Pain in unspecified knee: Secondary | ICD-10-CM | POA: Insufficient documentation

## 2011-02-16 DIAGNOSIS — M129 Arthropathy, unspecified: Secondary | ICD-10-CM | POA: Insufficient documentation

## 2011-02-16 DIAGNOSIS — M25469 Effusion, unspecified knee: Secondary | ICD-10-CM | POA: Insufficient documentation

## 2011-02-16 DIAGNOSIS — I10 Essential (primary) hypertension: Secondary | ICD-10-CM | POA: Insufficient documentation

## 2011-02-16 DIAGNOSIS — Z79899 Other long term (current) drug therapy: Secondary | ICD-10-CM | POA: Insufficient documentation

## 2011-02-16 DIAGNOSIS — F319 Bipolar disorder, unspecified: Secondary | ICD-10-CM | POA: Insufficient documentation

## 2011-02-16 DIAGNOSIS — M25562 Pain in left knee: Secondary | ICD-10-CM

## 2011-02-16 MED ORDER — OXYCODONE-ACETAMINOPHEN 5-325 MG PO TABS: 1.0000 | ORAL_TABLET | ORAL | Status: DC | PRN

## 2011-02-16 MED ORDER — PREDNISONE 20 MG PO TABS
60.0000 mg | ORAL_TABLET | Freq: Once | ORAL | Status: AC
  Administered 2011-02-16: 60 mg via ORAL
  Filled 2011-02-16: qty 3

## 2011-02-16 MED ORDER — OXYCODONE-ACETAMINOPHEN 5-325 MG PO TABS
1.0000 | ORAL_TABLET | Freq: Once | ORAL | Status: AC
  Administered 2011-02-16: 1 via ORAL
  Filled 2011-02-16: qty 1

## 2011-02-16 NOTE — ED Provider Notes (Signed)
History     CSN: [id number]  Arrival date & time 02/16/11  1850   First MD Initiated Contact with Patient 02/16/11 2151      Chief Complaint  Patient presents with  . Knee Pain    pt c/o pain to left knee, states that she injured knee 4 months ago and has had fluid taken off knee in past. pt states pain worsened last night.     (Consider location/radiation/quality/duration/timing/severity/associated sxs/prior treatment) HPI Alexandra Fitzgerald is a 51 y.o. female presents with c/o left knee pain and swelling leading to desire to be assessed in the ED. The sx(s) have been present for 4 days. Additional concerns are pain with walking. Causative factors are DJD. Palliative factors are rest. The distress associated is mild. The disorder has been present for several years. No local doctor. Had fluid drained in past.    Past Medical History  Diagnosis Date  . Hypertension   . Bipolar 1 disorder   . Arthritis   . Migraine headache     History reviewed. No pertinent past surgical history.  Family History  Problem Relation Age of Onset  . Tuberculosis Mother   . Stroke Mother   . Tuberculosis Father   . Heart disease Father   . Bipolar disorder Sister   . Drug abuse Sister     History  Substance Use Topics  . Smoking status: Never Smoker   . Smokeless tobacco: Never Used  . Alcohol Use: Yes    OB History    Grav Para Term Preterm Abortions TAB SAB Ect Mult Living                  Review of Systems  All other systems reviewed and are negative.    Allergies  Doxycycline; Flagyl; Penicillins; Shellfish allergy; and Sulfa antibiotics  Home Medications   Current Outpatient Rx  Name Route Sig Dispense Refill  . CLONIDINE HCL 0.1 MG PO TABS Oral Take 0.025-0.05 mg by mouth 3 (three) times daily. For anxiety and/or insomnia    . FLUOXETINE HCL 20 MG PO CAPS Oral Take 40 mg by mouth 2 (two) times daily.     . OXYCODONE-ACETAMINOPHEN 5-325 MG PO TABS Oral Take 1 tablet by  mouth every 4 (four) hours as needed for pain. 15 tablet 0    BP 107/61  Pulse 78  Temp(Src) 97.3 F (36.3 C) (Oral)  Resp 20  SpO2 97%  LMP 01/23/2011  Physical Exam  Constitutional: She is oriented to person, place, and time. She appears well-developed and well-nourished.  HENT:  Head: Normocephalic.  Eyes: Conjunctivae are normal.  Neck: Normal range of motion.  Pulmonary/Chest: Effort normal.  Musculoskeletal: She exhibits no edema.       Left knee tender and stable; large effusion  Neurological: She is alert and oriented to person, place, and time. No cranial nerve deficit. She exhibits normal muscle tone. Coordination normal.    ED Course  Procedures (including critical care time) ED Treatment: Prednisone, Immobilizer, Percocet    Labs Reviewed - No data to display Dg Knee Complete 4 Views Left  02/16/2011  *RADIOLOGY REPORT*  Clinical Data: Knee pain  LEFT KNEE - COMPLETE 4+ VIEW  Comparison: None.  Findings: Normal alignment.  No fracture or large effusion. Preserved joint spaces.  IMPRESSION: No acute finding by plain radiography.  Original Report Authenticated By: Judie Petit. Ruel Favors, M.D.     1. Knee pain, left   2. Knee effusion  MDM  Recurrent knee effusion, likely, arthritis. Patient stable for discharge with outpatient management.       Flint Melter, MD 02/17/11 [phone]

## 2011-02-17 ENCOUNTER — Encounter (HOSPITAL_COMMUNITY): Payer: Self-pay | Admitting: *Deleted

## 2011-02-17 ENCOUNTER — Emergency Department (HOSPITAL_COMMUNITY): Payer: Self-pay

## 2011-02-17 ENCOUNTER — Emergency Department (HOSPITAL_COMMUNITY)
Admission: EM | Admit: 2011-02-17 | Discharge: 2011-02-17 | Disposition: A | Discharge status: Home / Self Care | Payer: Self-pay | Attending: Emergency Medicine | Admitting: Emergency Medicine

## 2011-02-17 DIAGNOSIS — M25569 Pain in unspecified knee: Secondary | ICD-10-CM | POA: Insufficient documentation

## 2011-02-17 DIAGNOSIS — M25562 Pain in left knee: Secondary | ICD-10-CM

## 2011-02-17 DIAGNOSIS — F319 Bipolar disorder, unspecified: Secondary | ICD-10-CM | POA: Insufficient documentation

## 2011-02-17 DIAGNOSIS — I10 Essential (primary) hypertension: Secondary | ICD-10-CM | POA: Insufficient documentation

## 2011-02-17 MED ORDER — HYDROCODONE-ACETAMINOPHEN 5-325 MG PO TABS: 1.0000 | ORAL_TABLET | ORAL | Status: DC | PRN

## 2011-02-17 MED ORDER — HYDROCODONE-ACETAMINOPHEN 5-325 MG PO TABS
1.0000 | ORAL_TABLET | Freq: Once | ORAL | Status: AC
  Administered 2011-02-17: 1 via ORAL
  Filled 2011-02-17: qty 1

## 2011-02-17 NOTE — ED Provider Notes (Signed)
History     CSN: [id number]  Arrival date & time 02/17/11  1317   First MD Initiated Contact with Patient 02/17/11 1432      Chief Complaint  Patient presents with  . Knee Pain    left    (Consider location/radiation/quality/duration/timing/severity/associated sxs/prior treatment) HPI Comments: Patient presents with left knee pain that has been ongoing since a fall 4 months ago.  Patient has had an effusion drained once on it and feels she may need that today.  She has no fevers, redness, warmth to the joint.  She can bear some weight but has pain with movement of the left knee. sHe has not seen an orthopedist for this.  Patient is a 51 y.o. female presenting with knee pain. The history is provided by the patient. No language interpreter was used.  Knee Pain This is a chronic problem. The current episode started more than 1 week ago. The problem occurs constantly. The problem has not changed since onset.Pertinent negatives include no chest pain, no abdominal pain, no headaches and no shortness of breath. The symptoms are aggravated by standing.    Past Medical History  Diagnosis Date  . Hypertension   . Bipolar 1 disorder   . Arthritis   . Migraine headache     History reviewed. No pertinent past surgical history.  Family History  Problem Relation Age of Onset  . Tuberculosis Mother   . Stroke Mother   . Tuberculosis Father   . Heart disease Father   . Bipolar disorder Sister   . Drug abuse Sister     History  Substance Use Topics  . Smoking status: Never Smoker   . Smokeless tobacco: Never Used  . Alcohol Use: Yes     occ    OB History    Grav Para Term Preterm Abortions TAB SAB Ect Mult Living                  Review of Systems  Constitutional: Negative.  Negative for fever and chills.  HENT: Negative.   Eyes: Negative.  Negative for discharge and redness.  Respiratory: Negative.  Negative for cough and shortness of breath.   Cardiovascular: Negative.   Negative for chest pain.  Gastrointestinal: Negative.  Negative for nausea, vomiting, abdominal pain and diarrhea.  Genitourinary: Negative.  Negative for dysuria and vaginal discharge.  Musculoskeletal: Positive for joint swelling and arthralgias. Negative for back pain.  Skin: Negative.  Negative for color change and rash.  Neurological: Negative.  Negative for syncope and headaches.  Hematological: Negative.  Negative for adenopathy.  Psychiatric/Behavioral: Negative.  Negative for confusion.  All other systems reviewed and are negative.    Allergies  Doxycycline; Flagyl; Penicillins; Shellfish allergy; and Sulfa antibiotics  Home Medications   Current Outpatient Rx  Name Route Sig Dispense Refill  . CLONIDINE HCL 0.1 MG PO TABS Oral Take by mouth 3 (three) times daily.     Marland Kitchen FLUOXETINE HCL 20 MG PO CAPS Oral Take 40 mg by mouth 2 (two) times daily.       BP 111/82  Pulse 112  Temp(Src) 97.6 F (36.4 C) (Oral)  Resp 20  SpO2 96%  LMP 01/23/2011  Physical Exam  Nursing note and vitals reviewed. Constitutional: She is oriented to person, place, and time. She appears well-developed and well-nourished.  Non-toxic appearance. She does not have a sickly appearance.  HENT:  Head: Normocephalic and atraumatic.  Eyes: Conjunctivae, EOM and lids are normal. Pupils are equal,  round, and reactive to light. No scleral icterus.  Neck: Trachea normal and normal range of motion. Neck supple.  Cardiovascular: Normal rate.   Pulmonary/Chest: Effort normal.  Abdominal: Soft. Normal appearance. There is no CVA tenderness.  Musculoskeletal: Normal range of motion.       Patient has minimal swelling to left knee.  No erythema.  No crepitus.  No warmth.  Patient can flex and extend the knee.  There is no medial or lateral joint laxity.  Neurological: She is alert and oriented to person, place, and time. She has normal strength.  Skin: Skin is warm, dry and intact. No rash noted.    Psychiatric: She has a normal mood and affect. Her behavior is normal. Judgment and thought content normal.    ED Course  Procedures (including critical care time)  Labs Reviewed - No data to display Dg Knee Complete 4 Views Left  02/17/2011  *RADIOLOGY REPORT*  Clinical Data: Left knee pain and swelling.  LEFT KNEE - COMPLETE 4+ VIEW  Comparison: 02/16/2011  Findings: There is no evidence of acute fracture, subluxation or dislocation. There may be a small knee effusion present. Mild to moderate degenerative changes within the medial and patellofemoral compartments noted. No focal bony lesions are present.  IMPRESSION: No evidence of acute abnormality.  Mild to moderate degenerative changes within the medial and patellofemoral compartments with possible small knee effusion.  Original Report Authenticated By: Rosendo Gros, M.D.   Dg Knee Complete 4 Views Left  02/16/2011  *RADIOLOGY REPORT*  Clinical Data: Knee pain  LEFT KNEE - COMPLETE 4+ VIEW  Comparison: None.  Findings: Normal alignment.  No fracture or large effusion. Preserved joint spaces.  IMPRESSION: No acute finding by plain radiography.  Original Report Authenticated By: Judie Petit. Ruel Favors, M.D.        MDM  Patient presents with signs of degenerative changes in her left knee which is her likely cause of her pain.  There is no erythema or warmth to suggest septic arthritis.  Patient can flex and extend her joint.  She has no significant effusion to drain at this point in time.  I will refer the patient to Dr. Lajoyce Corners from orthopedics who is on call today.        Nat Christen, MD 02/17/11 [phone]

## 2011-02-17 NOTE — Discharge Instructions (Signed)
Knee Pain Knee pain can be a result of an injury or other medical conditions. Treatment will depend on the cause of your pain. HOME CARE  Only take medicine as told by your doctor.   Keep a healthy weight. Being overweight can make the knee hurt more.   Stretch before exercising or playing sports.   If there is constant knee pain, change the way you exercise. Ask your doctor for advice.   Make sure shoes fit well. Choose the right shoe for the sport or activity.   Protect your knees. Wear kneepads if needed.   Rest when you are tired.  GET HELP RIGHT AWAY IF:   Your knee pain does not stop.   Your knee pain does not get better.   Your knee joint feels hot to the touch.   You have a temperature by mouth above 102 F (38.9 C), not controlled by medicine.   Your baby is older than 3 months with a rectal temperature of 102 F (38.9 C) or higher.   Your baby is 80 months old or younger with a rectal temperature of 100.4 F (38 C) or higher.  MAKE SURE YOU:   Understand these instructions.   Will watch this condition.   Will get help right away if you are not doing well or get worse.  Document Released: 04/15/2008 Document Revised: 09/29/2010 Document Reviewed: 04/15/2008 [facility] Patient Information 2012 [place], [place].

## 2011-02-17 NOTE — ED Notes (Signed)
Pt is here with left knee pain and swelling and thinks she may need fluid drawn off of it.  CMS intact

## 2011-02-22 ENCOUNTER — Emergency Department (HOSPITAL_COMMUNITY): Payer: Self-pay

## 2011-02-22 ENCOUNTER — Emergency Department (HOSPITAL_COMMUNITY)
Admission: EM | Admit: 2011-02-22 | Discharge: 2011-02-22 | Disposition: A | Discharge status: Home / Self Care | Payer: Self-pay | Attending: Emergency Medicine | Admitting: Emergency Medicine

## 2011-02-22 ENCOUNTER — Encounter (HOSPITAL_COMMUNITY): Payer: Self-pay | Admitting: Emergency Medicine

## 2011-02-22 DIAGNOSIS — I10 Essential (primary) hypertension: Secondary | ICD-10-CM | POA: Insufficient documentation

## 2011-02-22 DIAGNOSIS — Z8739 Personal history of other diseases of the musculoskeletal system and connective tissue: Secondary | ICD-10-CM | POA: Insufficient documentation

## 2011-02-22 DIAGNOSIS — S0083XA Contusion of other part of head, initial encounter: Secondary | ICD-10-CM | POA: Insufficient documentation

## 2011-02-22 DIAGNOSIS — S0003XA Contusion of scalp, initial encounter: Secondary | ICD-10-CM | POA: Insufficient documentation

## 2011-02-22 DIAGNOSIS — F319 Bipolar disorder, unspecified: Secondary | ICD-10-CM | POA: Insufficient documentation

## 2011-02-22 MED ORDER — OXYCODONE-ACETAMINOPHEN 5-325 MG PO TABS
2.0000 | ORAL_TABLET | Freq: Once | ORAL | Status: AC
  Administered 2011-02-22: 2 via ORAL
  Filled 2011-02-22: qty 2

## 2011-02-22 MED ORDER — TRAMADOL HCL 50 MG PO TABS: 50.0000 mg | ORAL_TABLET | Freq: Four times a day (QID) | ORAL | Status: DC | PRN
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
## 2011-02-22 NOTE — ED Notes (Signed)
No s/s of acute distress noted at this time.
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
## 2011-02-23 NOTE — ED Provider Notes (Signed)
Medical screening examination/treatment/procedure(s) were performed by non-physician practitioner and as supervising physician I was immediately available for consultation/collaboration.  Nikoleta Dady, MD 02/23/11 0004 

## 2011-02-24 ENCOUNTER — Emergency Department (HOSPITAL_COMMUNITY)
Admission: EM | Admit: 2011-02-24 | Discharge: 2011-02-25 | Disposition: A | Discharge status: Home / Self Care | Payer: Self-pay | Attending: Emergency Medicine | Admitting: Emergency Medicine

## 2011-02-24 ENCOUNTER — Encounter (HOSPITAL_COMMUNITY): Payer: Self-pay | Admitting: Emergency Medicine

## 2011-02-24 DIAGNOSIS — I10 Essential (primary) hypertension: Secondary | ICD-10-CM | POA: Insufficient documentation

## 2011-02-24 DIAGNOSIS — J111 Influenza due to unidentified influenza virus with other respiratory manifestations: Secondary | ICD-10-CM | POA: Insufficient documentation

## 2011-02-24 DIAGNOSIS — R6889 Other general symptoms and signs: Secondary | ICD-10-CM

## 2011-02-24 DIAGNOSIS — R112 Nausea with vomiting, unspecified: Secondary | ICD-10-CM | POA: Insufficient documentation

## 2011-02-24 LAB — BASIC METABOLIC PANEL
BUN: 11 mg/dL (ref 6–23)
CO2: 20 mEq/L (ref 19–32)
GFR calc non Af Amer: >90 mL/min (ref >90)
Glucose, Bld: 85 mg/dL (ref 70–99)
Potassium: 3.8 mEq/L (ref 3.5–5.1)

## 2011-02-24 LAB — URINALYSIS, ROUTINE W REFLEX MICROSCOPIC
Bilirubin Urine: NEGATIVE
Glucose, UA: NEGATIVE mg/dL
Hgb urine dipstick: NEGATIVE
Specific Gravity, Urine: 1.024 (ref 1.005–1.030)
Urobilinogen, UA: 1 mg/dL (ref 0.0–1.0)

## 2011-02-24 LAB — CBC
HCT: 34.5 % — ABNORMAL LOW (ref 36.0–46.0)
Hemoglobin: 11 g/dL — ABNORMAL LOW (ref 12.0–15.0)
MCH: 28.9 pg (ref 26.0–34.0)
MCHC: 31.9 g/dL (ref 30.0–36.0)

## 2011-02-24 MED ORDER — MORPHINE SULFATE 2 MG/ML IJ SOLN
2.0000 mg | Freq: Once | INTRAMUSCULAR | Status: AC
  Administered 2011-02-24: 2 mg via INTRAVENOUS
  Filled 2011-02-24: qty 1

## 2011-02-24 MED ORDER — ONDANSETRON HCL 4 MG/2ML IJ SOLN
4.0000 mg | Freq: Once | INTRAMUSCULAR | Status: AC
  Administered 2011-02-24: 4 mg via INTRAVENOUS

## 2011-02-24 MED ORDER — SODIUM CHLORIDE 0.9 % IV BOLUS (SEPSIS)
1000.0000 mL | Freq: Once | INTRAVENOUS | Status: AC
  Administered 2011-02-24: 1000 mL via INTRAVENOUS

## 2011-02-24 MED ORDER — ONDANSETRON HCL 4 MG/2ML IJ SOLN
4.0000 mg | Freq: Once | INTRAMUSCULAR | Status: DC
  Filled 2011-02-24: qty 2

## 2011-02-24 MED ORDER — SODIUM CHLORIDE 0.9 % IV BOLUS (SEPSIS)
1000.0000 mL | Freq: Once | INTRAVENOUS | Status: AC
  Administered 2011-02-25: 1000 mL via INTRAVENOUS

## 2011-02-24 NOTE — ED Provider Notes (Signed)
History     CSN: [id number]  Arrival date & time 02/24/11  2148   First MD Initiated Contact with Patient 02/24/11 2222      Chief Complaint  Patient presents with  . Flu like Symptoms     (Consider location/radiation/quality/duration/timing/severity/associated sxs/prior treatment) HPI  Pt presents to the ED with complaints of nausea, vomiting and body aches for the past 3 days. Pt denies cough, although she did get over a URI recently. She has not tried taking any medications for her symptoms because she has not had any. Pt denies weakness, lethargy, syncope, LOC, or recent injury. Pt denies abdominal pain or headaches   Past Medical History  Diagnosis Date  . Hypertension   . Bipolar 1 disorder   . Arthritis   . Migraine headache     History reviewed. No pertinent past surgical history.  Family History  Problem Relation Age of Onset  . Tuberculosis Mother   . Stroke Mother   . Tuberculosis Father   . Heart disease Father   . Bipolar disorder Sister   . Drug abuse Sister     History  Substance Use Topics  . Smoking status: Never Smoker   . Smokeless tobacco: Never Used  . Alcohol Use: Yes     occ    OB History    Grav Para Term Preterm Abortions TAB SAB Ect Mult Living                  Review of Systems  All other systems reviewed and are negative.    Allergies  Doxycycline; Flagyl; Penicillins; Shellfish allergy; and Sulfa antibiotics  Home Medications   Current Outpatient Rx  Name Route Sig Dispense Refill  . CLONIDINE HCL 0.1 MG PO TABS Oral Take 0.1 mg by mouth 3 (three) times daily.     Marland Kitchen FLUOXETINE HCL 20 MG PO CAPS Oral Take 40 mg by mouth 2 (two) times daily.     Marland Kitchen BENZONATATE 100 MG PO CAPS Oral Take 1 capsule (100 mg total) by mouth every 8 (eight) hours. 21 capsule 0  . HYDROCODONE-ACETAMINOPHEN 5-325 MG PO TABS Oral Take 1 tablet by mouth every 4 (four) hours as needed for pain. 6 tablet 0  . ONDANSETRON 4 MG PO TBDP Oral Take 1  tablet (4 mg total) by mouth every 8 (eight) hours as needed for nausea. 20 tablet 0  . TRAMADOL HCL 50 MG PO TABS Oral Take 1 tablet (50 mg total) by mouth every 6 (six) hours as needed for pain. 15 tablet 0    BP 117/56  Pulse 69  Temp(Src) 98.5 F (36.9 C) (Oral)  Resp 16  Wt 175 lb (79.379 kg)  SpO2 97%  LMP 01/23/2011  Physical Exam  Constitutional: She appears well-developed and well-nourished.  HENT:  Head: Normocephalic and atraumatic.  Eyes: Conjunctivae are normal. Pupils are equal, round, and reactive to light.  Neck: Trachea normal, normal range of motion and full passive range of motion without pain. Neck supple.  Cardiovascular: Normal rate, regular rhythm and normal pulses.   Pulmonary/Chest: Effort normal and breath sounds normal. Chest wall is not dull to percussion. She exhibits no tenderness, no crepitus, no edema, no deformity and no retraction.  Abdominal: Soft. Normal appearance and bowel sounds are normal. She exhibits no distension. There is no tenderness. There is no rebound and no guarding.  Musculoskeletal: Normal range of motion.  Neurological: She is alert. She has normal strength.  Skin: Skin is warm,  dry and intact.  Psychiatric: Her speech is normal. Cognition and memory are normal.    ED Course  Procedures (including critical care time)  Labs Reviewed  CBC - Abnormal; Notable for the following:    RBC 3.80 (*)    Hemoglobin 11.0 (*)    HCT 34.5 (*)    All other components within normal limits  URINALYSIS, ROUTINE W REFLEX MICROSCOPIC - Abnormal; Notable for the following:    Ketones, ur 15 (*)    All other components within normal limits  BASIC METABOLIC PANEL  LIPASE, BLOOD   No results found.   1. Nausea and vomiting   2. Flu-like symptoms       MDM  Pt vomiting upon my arrival into exam room, pt give 2L fluid, Morphine and 4mg  Zofran and is now feeling much better. Lab work is negative for any acute abnormalities. Pt dehydrated  on arrival. Pt will be DC with antinausea medication and pain medication.  At discharge patient feels comfortable going home with plan and will return to the ED if symptoms change or worsen.        Dorthula Matas, PA 02/25/11 [phone]

## 2011-02-24 NOTE — ED Notes (Signed)
Pt alert, nad, c/o n/v, body aches, onset a few days ago, resp even unlabored, skin pwd, MMM

## 2011-02-25 MED ORDER — BENZONATATE 100 MG PO CAPS: 100.0000 mg | ORAL_CAPSULE | Freq: Three times a day (TID) | ORAL | Status: DC

## 2011-02-25 MED ORDER — ONDANSETRON 4 MG PO TBDP: 4.0000 mg | ORAL_TABLET | Freq: Three times a day (TID) | ORAL | Status: DC | PRN

## 2011-02-25 MED ORDER — HYDROCODONE-ACETAMINOPHEN 5-325 MG PO TABS: 1.0000 | ORAL_TABLET | ORAL | Status: DC | PRN

## 2011-02-25 MED ORDER — MORPHINE SULFATE 4 MG/ML IJ SOLN
4.0000 mg | Freq: Once | INTRAMUSCULAR | Status: AC
  Administered 2011-02-25: 4 mg via INTRAVENOUS
  Filled 2011-02-25: qty 1

## 2011-02-25 NOTE — ED Notes (Signed)
Pt is alert and oriented x 4 with respirations even and unlabored.  NAD at this time.  Discharge instructions reviewed with patient and patient verbalized understanding.  Pt ambulated to lobby with steady gait and husband to transport patient home.

## 2011-02-25 NOTE — ED Provider Notes (Signed)
Medical screening examination/treatment/procedure(s) were performed by non-physician practitioner and as supervising physician I was immediately available for consultation/collaboration.   Makell Drohan D Eliu Batch, MD 02/25/11 0802 

## 2011-02-27 ENCOUNTER — Emergency Department (HOSPITAL_COMMUNITY)
Admission: EM | Admit: 2011-02-27 | Discharge: 2011-02-28 | Disposition: A | Discharge status: Home / Self Care | Payer: Self-pay | Attending: Emergency Medicine | Admitting: Emergency Medicine

## 2011-02-27 ENCOUNTER — Encounter (HOSPITAL_COMMUNITY): Payer: Self-pay | Admitting: Emergency Medicine

## 2011-02-27 ENCOUNTER — Emergency Department (HOSPITAL_COMMUNITY): Payer: Self-pay

## 2011-02-27 DIAGNOSIS — R509 Fever, unspecified: Secondary | ICD-10-CM | POA: Insufficient documentation

## 2011-02-27 DIAGNOSIS — IMO0001 Reserved for inherently not codable concepts without codable children: Secondary | ICD-10-CM | POA: Insufficient documentation

## 2011-02-27 DIAGNOSIS — Z79899 Other long term (current) drug therapy: Secondary | ICD-10-CM | POA: Insufficient documentation

## 2011-02-27 DIAGNOSIS — R1084 Generalized abdominal pain: Secondary | ICD-10-CM | POA: Insufficient documentation

## 2011-02-27 DIAGNOSIS — B349 Viral infection, unspecified: Secondary | ICD-10-CM

## 2011-02-27 DIAGNOSIS — F319 Bipolar disorder, unspecified: Secondary | ICD-10-CM | POA: Insufficient documentation

## 2011-02-27 DIAGNOSIS — R059 Cough, unspecified: Secondary | ICD-10-CM | POA: Insufficient documentation

## 2011-02-27 DIAGNOSIS — M129 Arthropathy, unspecified: Secondary | ICD-10-CM | POA: Insufficient documentation

## 2011-02-27 DIAGNOSIS — R05 Cough: Secondary | ICD-10-CM | POA: Insufficient documentation

## 2011-02-27 DIAGNOSIS — B9789 Other viral agents as the cause of diseases classified elsewhere: Secondary | ICD-10-CM | POA: Insufficient documentation

## 2011-02-27 DIAGNOSIS — I10 Essential (primary) hypertension: Secondary | ICD-10-CM | POA: Insufficient documentation

## 2011-02-27 DIAGNOSIS — R112 Nausea with vomiting, unspecified: Secondary | ICD-10-CM | POA: Insufficient documentation

## 2011-02-27 DIAGNOSIS — J40 Bronchitis, not specified as acute or chronic: Secondary | ICD-10-CM | POA: Insufficient documentation

## 2011-02-27 LAB — POCT I-STAT, CHEM 8
BUN: 7 mg/dL (ref 6–23)
Chloride: 105 mEq/L (ref 96–112)
HCT: 35 % — ABNORMAL LOW (ref 36.0–46.0)
Potassium: 3.5 mEq/L (ref 3.5–5.1)
Sodium: 139 mEq/L (ref 135–145)

## 2011-02-27 MED ORDER — ONDANSETRON HCL 4 MG/2ML IJ SOLN
4.0000 mg | Freq: Once | INTRAMUSCULAR | Status: AC
  Administered 2011-02-27: 4 mg via INTRAVENOUS
  Filled 2011-02-27: qty 2

## 2011-02-27 MED ORDER — MORPHINE SULFATE 4 MG/ML IJ SOLN
4.0000 mg | Freq: Once | INTRAMUSCULAR | Status: AC
  Administered 2011-02-28: 4 mg via INTRAVENOUS
  Filled 2011-02-27: qty 1

## 2011-02-27 MED ORDER — SODIUM CHLORIDE 0.9 % IV BOLUS (SEPSIS)
1000.0000 mL | Freq: Once | INTRAVENOUS | Status: AC
  Administered 2011-02-27: 1000 mL via INTRAVENOUS

## 2011-02-27 MED ORDER — KETOROLAC TROMETHAMINE 60 MG/2ML IM SOLN: 60.0000 mg | Freq: Once | INTRAMUSCULAR | Status: DC

## 2011-02-27 MED ORDER — ONDANSETRON 4 MG PO TBDP
4.0000 mg | ORAL_TABLET | Freq: Once | ORAL | Status: AC
  Administered 2011-02-27: 4 mg via ORAL
  Filled 2011-02-27: qty 1

## 2011-02-27 MED ORDER — KETOROLAC TROMETHAMINE 30 MG/ML IJ SOLN
30.0000 mg | Freq: Once | INTRAMUSCULAR | Status: AC
  Administered 2011-02-27: 30 mg via INTRAVENOUS
  Filled 2011-02-27: qty 1

## 2011-02-27 NOTE — ED Provider Notes (Signed)
History     CSN: [id number]  Arrival date & time 02/27/11  4098   First MD Initiated Contact with Patient 02/27/11 2155      Chief Complaint  Patient presents with  . Cough    HPI: Patient is a 51 y.o. female presenting with cough.  Cough This is a new problem. The problem occurs every few minutes. The problem has been gradually worsening. The cough is productive of sputum. The maximum temperature recorded prior to her arrival was 100 to 100.9 F. Associated symptoms include chills and myalgias. Pertinent negatives include no chest pain, no ear congestion, no rhinorrhea and no sore throat. She has tried nothing for the symptoms. She is not a smoker.  Pt reports 2 days hx of persistent cough, bodyaches and N/V. States had fever of 100 yesterday but not today. Reports appprox 20 episodes of vomiting in last 24 hours though admits a lot of th episodes are post-tussive. Also reports some intermittent generalized abd pain.    Past Medical History  Diagnosis Date  . Hypertension   . Bipolar 1 disorder   . Arthritis   . Migraine headache     History reviewed. No pertinent past surgical history.  Family History  Problem Relation Age of Onset  . Tuberculosis Mother   . Stroke Mother   . Tuberculosis Father   . Heart disease Father   . Bipolar disorder Sister   . Drug abuse Sister     History  Substance Use Topics  . Smoking status: Never Smoker   . Smokeless tobacco: Never Used  . Alcohol Use: Yes     occ    OB History    Grav Para Term Preterm Abortions TAB SAB Ect Mult Living                  Review of Systems  Constitutional: Positive for chills.  HENT: Negative.  Negative for sore throat and rhinorrhea.   Eyes: Negative.   Respiratory: Positive for cough.   Cardiovascular: Negative.  Negative for chest pain.  Gastrointestinal: Negative.  Negative for vomiting, constipation and abdominal distention.  Genitourinary: Negative.  Negative for dysuria and frequency.    Musculoskeletal: Positive for myalgias.  Skin: Negative.   Neurological: Negative.   Hematological: Negative.   Psychiatric/Behavioral: Negative.     Allergies  Doxycycline; Flagyl; Penicillins; Shellfish allergy; and Sulfa antibiotics  Home Medications   Current Outpatient Rx  Name Route Sig Dispense Refill  . CLONIDINE HCL 0.1 MG PO TABS Oral Take 0.1 mg by mouth 3 (three) times daily.     Marland Kitchen FLUOXETINE HCL 20 MG PO CAPS Oral Take 40 mg by mouth 2 (two) times daily.       BP 125/75  Pulse 74  Temp(Src) 98.3 F (36.8 C) (Oral)  Resp 20  SpO2 97%  LMP 01/23/2011  Physical Exam  Constitutional: She is oriented to person, place, and time. She appears well-developed and well-nourished.  HENT:  Head: Normocephalic and atraumatic.  Eyes: Conjunctivae are normal.  Neck: Neck supple.  Cardiovascular: Normal rate and regular rhythm.   Pulmonary/Chest: Effort normal and breath sounds normal.       Occassional coughing noted  Abdominal: Soft. Normal appearance and bowel sounds are normal.       No objective TTP. Frequent wretcing w/o emesis  Musculoskeletal: Normal range of motion.  Neurological: She is alert and oriented to person, place, and time.  Skin: Skin is warm and dry. No erythema.  Psychiatric:  She has a normal mood and affect.    ED Course  Procedures  Pt reports abd pain much improved w/ medication. There has been no vomiting since arrival to ED. Findings and clinical impression discussed w/ pt. Will plan for d/c home w/ tx for bronchitis, medication for nausea and PCP referrals. Pt agreeable w/ plan.   Labs Reviewed  I-STAT, CHEM 8  URINALYSIS, ROUTINE W REFLEX MICROSCOPIC   No results found.   No diagnosis found.    MDM  HPI/PE and clinical findings c/w  1. Brochitis 2. Viral syndrome        Leanne Chang, NP 02/28/11 [phone]

## 2011-02-27 NOTE — ED Notes (Signed)
Pt denies cough.  States she has been vomiting, having diarrhea, abd pain and fevers for 2 days.  Pt ao x 4.

## 2011-02-27 NOTE — ED Notes (Signed)
PT. REPORTS PRODUCTIVE COUGH WITH VOMITTING AND DIARRHEA, ,BODY ACHES , JOINT PAINS AND CHILLS FOR 2 DAYS.

## 2011-02-27 NOTE — ED Notes (Signed)
PT spitting up water immediately after taking odt zofran.

## 2011-02-28 LAB — URINALYSIS, ROUTINE W REFLEX MICROSCOPIC
Bilirubin Urine: NEGATIVE
Glucose, UA: NEGATIVE mg/dL
Hgb urine dipstick: NEGATIVE
Ketones, ur: NEGATIVE mg/dL
Leukocytes, UA: NEGATIVE
Nitrite: NEGATIVE
Protein, ur: NEGATIVE mg/dL
Specific Gravity, Urine: 1.023 (ref 1.005–1.030)
Urobilinogen, UA: 1 mg/dL (ref 0.0–1.0)
pH: 6.5 (ref 5.0–8.0)

## 2011-02-28 MED ORDER — ALBUTEROL SULFATE HFA 108 (90 BASE) MCG/ACT IN AERS
2.0000 | INHALATION_SPRAY | RESPIRATORY_TRACT | Status: DC | PRN
  Administered 2011-02-28: 2 via RESPIRATORY_TRACT
  Filled 2011-02-28: qty 6.7

## 2011-02-28 MED ORDER — PANTOPRAZOLE SODIUM 40 MG IV SOLR
40.0000 mg | Freq: Once | INTRAVENOUS | Status: AC
  Administered 2011-02-28: 40 mg via INTRAVENOUS
  Filled 2011-02-28: qty 40

## 2011-02-28 MED ORDER — MORPHINE SULFATE 4 MG/ML IJ SOLN
4.0000 mg | Freq: Once | INTRAMUSCULAR | Status: AC
  Administered 2011-02-28: 4 mg via INTRAVENOUS
  Filled 2011-02-28: qty 1

## 2011-02-28 MED ORDER — PROMETHAZINE HCL 25 MG PO TABS: 25.0000 mg | ORAL_TABLET | Freq: Four times a day (QID) | ORAL | Status: DC | PRN

## 2011-02-28 NOTE — ED Provider Notes (Signed)
Medical screening examination/treatment/procedure(s) were performed by non-physician practitioner and as supervising physician I was immediately available for consultation/collaboration.   Dodger Sinning, MD 02/28/11 2010 

## 2011-02-28 NOTE — ED Notes (Signed)
Rx x 1, pt voiced understanding to f/u with PCP and find a PCP in the area.

## 2011-02-28 NOTE — Discharge Instructions (Signed)
Please read over the instructions below. Your lab work and urine tests tonight were completely normal. The chest x-ray shows changes consistent with bronchitis but no pneumonia. The likely source of your symptoms is a viral syndrome. As discussed, there was a nodule noted on your chest x-ray that needs further investigation. You need to get established with a primary care physician for further evaluation of this lung nodule. This could be something very benign but also could be something very serious such as cancer. It is very important to arrange this follow up. We are prescribing medication for nausea to use at home as directed. We have also provided an inhaler for you to use (2 inhalations every 4-6 hours as needed for cough, shortness of breath and or wheezing). We have provided the "[facility]" number above and the resource list below to assist you in getting established with primary care physician. We have also attached a referral to the [facility] health care clinic which you may consider for primary care as well. Return if your symptoms worsen otherwise arrange follow up as discussed.   B.R.A.T. Diet  Your doctor has recommended the B.R.A.T. diet for you or your child until the condition improves. This is often used to help control diarrhea and vomiting symptoms. If you or your child can tolerate clear liquids, you may have:  Bananas.   Rice.   Applesauce.   Toast (and other simple starches such as crackers, potatoes, noodles).  Be sure to avoid dairy products, meats, and fatty foods until symptoms are better. Fruit juices such as apple, grape, and prune juice can make diarrhea worse. Avoid these. Continue this diet for 2 days or as instructed by your caregiver. Document Released: 01/17/2005 Document Revised: 09/29/2010 Document Reviewed: 07/06/2006 [facility] Patient Information 2012 [place], [place]  .Bronchitis  Bronchitis is a problem of the air tubes leading to your  lungs. This problem makes it hard for air to get in and out of the lungs. You may cough a lot because your air tubes are narrow. Going without care can cause lasting (chronic) bronchitis. HOME CARE   Drink enough fluids to keep your pee (urine) clear or pale yellow.   Use a cool mist humidifier.   Quit smoking if you smoke. If you keep smoking, the bronchitis might not get better.   Only take medicine as told by your doctor.  GET HELP RIGHT AWAY IF:   Coughing keeps you awake.   You start to wheeze.   You become more sick or weak.   You have a hard time breathing or get short of breath.   You cough up blood.   Coughing lasts more than 2 weeks.   You have a fever.   Your baby is older than 3 months with a rectal temperature of 102 F (38.9 C) or higher.   Your baby is 5 months old or younger with a rectal temperature of 100.4 F (38 C) or higher.  MAKE SURE YOU:  Understand these instructions.   Will watch your condition.   Will get help right away if you are not doing well or get worse.  Document Released: 07/06/2007 Document Revised: 09/29/2010 Document Reviewed: 12/19/2008 [facility] Patient Information 2012 [place], [place].  Nausea and Vomiting  Nausea means you feel sick to your stomach. Throwing up (vomiting) is a reflex where stomach contents come out of your mouth. HOME CARE   Take medicine as told by your doctor.   Do not force yourself to eat. However,  you do need to drink fluids.   If you feel like eating, eat a normal diet as told by your doctor.   Eat rice, wheat, potatoes, bread, lean meats, yogurt, fruits, and vegetables.   Avoid high-fat foods.   Drink enough fluids to keep your pee (urine) clear or pale yellow.   Ask your doctor how to replace body fluid losses (rehydrate). Signs of body fluid loss (dehydration) include:   Feeling very thirsty.   Dry lips and mouth.   Feeling dizzy.   Dark pee.   Peeing less than normal.   Feeling  confused.   Fast breathing or heart rate.  GET HELP RIGHT AWAY IF:   You have blood in your throw up.   You have black or bloody poop (stool).   You have a bad headache or stiff neck.   You feel confused.   You have bad belly (abdominal) pain.   You have chest pain or trouble breathing.   You do not pee at least once every 8 hours.   You have cold, clammy skin.   You keep throwing up after 24 to 48 hours.   You have a fever.  MAKE SURE YOU:   Understand these instructions.   Will watch your condition.   Will get help right away if you are not doing well or get worse.  Document Released: 07/06/2007 Document Revised: 09/29/2010 Document Reviewed: 06/18/2010 [facility] Patient Information 2012 [place], [place].  RESOURCE GUIDE  Dental Problems  Patients with Medicaid: [facility] [phone] [address]                                           [phone] [address] Phone:  [phone]                                                  Phone:  [phone]  If unable to pay or uninsured, contact:  [facility] to become qualified for the adult dental clinic.  Chronic Pain Problems Contact [facility]  [phone] Patients need to be referred by their primary care doctor.  Insufficient Money for Medicine Contact [employer]:  call "211" or [facility] [phone].  No Primary Care Doctor Call [facility]  [phone] Other agencies that provide inexpensive medical care    [facility]  [phone]    [facility]  [phone]    [facility]  [phone]    [facility]  [phone]    [facility]  [phone]    [facility]  [phone]  Psychological Services [facility]  [phone] [facility] Services  [phone] [facility]   [phone] (emergency services [phone])  Substance Abuse  Resources Alcohol and Drug Services  [phone] [facility] [phone] [place] [phone] Floydene Flock [phone] Residential & Outpatient Substance Abuse Program  [phone]  Abuse/Neglect [facility] Child Abuse Hotline 306-177-3854 [facility] Child Abuse Hotline (763) [phone] (After Hours)  Emergency Shelter [facility] [phone]  Maternity Homes Room at the  [facility] [place] [facility] [place] [phone] [facility] Services [phone]  MRSA Hotline #:   (212) 435-3427    [facility] Resources  [facility] [place]     [employer]                          [facility] Dept. [address] [place]                       [address]      [address]  Blondell Reveal Phone:  [phone]                                   Phone:  [phone]                 Phone:  [phone]  [facility] Phone:  [phone]  [facility] Child Abuse Hotline 236-829-2265 [phone] (After Hours)

## 2011-03-17 ENCOUNTER — Emergency Department (HOSPITAL_COMMUNITY)
Admission: EM | Admit: 2011-03-17 | Discharge: 2011-03-18 | Disposition: A | Discharge status: Home / Self Care | Payer: Self-pay | Attending: Emergency Medicine | Admitting: Emergency Medicine

## 2011-03-17 ENCOUNTER — Encounter (HOSPITAL_COMMUNITY): Payer: Self-pay | Admitting: Emergency Medicine

## 2011-03-17 DIAGNOSIS — I10 Essential (primary) hypertension: Secondary | ICD-10-CM | POA: Insufficient documentation

## 2011-03-17 DIAGNOSIS — R197 Diarrhea, unspecified: Secondary | ICD-10-CM | POA: Insufficient documentation

## 2011-03-17 DIAGNOSIS — K529 Noninfective gastroenteritis and colitis, unspecified: Secondary | ICD-10-CM

## 2011-03-17 DIAGNOSIS — Z79899 Other long term (current) drug therapy: Secondary | ICD-10-CM | POA: Insufficient documentation

## 2011-03-17 DIAGNOSIS — K5289 Other specified noninfective gastroenteritis and colitis: Secondary | ICD-10-CM | POA: Insufficient documentation

## 2011-03-17 DIAGNOSIS — R1110 Vomiting, unspecified: Secondary | ICD-10-CM | POA: Insufficient documentation

## 2011-03-17 DIAGNOSIS — R109 Unspecified abdominal pain: Secondary | ICD-10-CM | POA: Insufficient documentation

## 2011-03-17 DIAGNOSIS — M129 Arthropathy, unspecified: Secondary | ICD-10-CM | POA: Insufficient documentation

## 2011-03-17 DIAGNOSIS — F319 Bipolar disorder, unspecified: Secondary | ICD-10-CM | POA: Insufficient documentation

## 2011-03-17 NOTE — ED Notes (Signed)
Bed:WA14<BR> Expected date:<BR> Expected time:<BR> Means of arrival:<BR> Comments:<BR> EMS-[facility]

## 2011-03-17 NOTE — ED Notes (Signed)
N/V since 1600. Pt states she feels weak and dizzy when she tries to stand. Has vomited about 10 times today. Pt BIB EMS.

## 2011-03-18 LAB — URINALYSIS, ROUTINE W REFLEX MICROSCOPIC
Leukocytes, UA: NEGATIVE
Nitrite: NEGATIVE
Protein, ur: NEGATIVE mg/dL

## 2011-03-18 LAB — COMPREHENSIVE METABOLIC PANEL
BUN: 17 mg/dL (ref 6–23)
CO2: 25 mEq/L (ref 19–32)
Chloride: 107 mEq/L (ref 96–112)
Creatinine, Ser: 0.62 mg/dL (ref 0.50–1.10)
GFR calc Af Amer: >90 mL/min (ref >90)
GFR calc non Af Amer: >90 mL/min (ref >90)
Total Bilirubin: 0.2 mg/dL — ABNORMAL LOW (ref 0.3–1.2)

## 2011-03-18 LAB — DIFFERENTIAL
Lymphocytes Relative: 11 % — ABNORMAL LOW (ref 12–46)
Monocytes Absolute: 0.7 10*3/uL (ref 0.1–1.0)
Monocytes Relative: 6 % (ref 3–12)
Neutro Abs: 9.3 10*3/uL — ABNORMAL HIGH (ref 1.7–7.7)

## 2011-03-18 LAB — CBC
HCT: 36.2 % (ref 36.0–46.0)
Hemoglobin: 12.2 g/dL (ref 12.0–15.0)
MCHC: 33.7 g/dL (ref 30.0–36.0)

## 2011-03-18 LAB — LIPASE, BLOOD: Lipase: 23 U/L (ref 11–59)

## 2011-03-18 MED ORDER — PROMETHAZINE HCL 25 MG PO TABS: 25.0000 mg | ORAL_TABLET | Freq: Four times a day (QID) | ORAL | Status: DC | PRN

## 2011-03-18 MED ORDER — MORPHINE SULFATE 4 MG/ML IJ SOLN
4.0000 mg | Freq: Once | INTRAMUSCULAR | Status: AC
  Administered 2011-03-18: 4 mg via INTRAVENOUS
  Filled 2011-03-18: qty 1

## 2011-03-18 MED ORDER — OXYCODONE-ACETAMINOPHEN 5-325 MG PO TABS: 1.0000 | ORAL_TABLET | Freq: Four times a day (QID) | ORAL | Status: DC | PRN

## 2011-03-18 MED ORDER — ONDANSETRON HCL 4 MG/2ML IJ SOLN
4.0000 mg | Freq: Once | INTRAMUSCULAR | Status: AC
  Administered 2011-03-18: 4 mg via INTRAVENOUS
  Filled 2011-03-18: qty 2

## 2011-03-18 NOTE — ED Notes (Signed)
MD at bedside. 

## 2011-03-18 NOTE — ED Provider Notes (Signed)
History     CSN: [id number]  Arrival date & time 03/17/11  2353   First MD Initiated Contact with Patient 03/18/11 0104      Chief Complaint  Patient presents with  . Emesis    (Consider location/radiation/quality/duration/timing/severity/associated sxs/prior treatment) Patient is a 51 y.o. female presenting with vomiting. The history is provided by the patient.  Emesis  This is a new problem. The current episode started 6 to 12 hours ago. The problem has been gradually worsening. The emesis has an appearance of stomach contents. There has been no fever. Associated symptoms include abdominal pain and diarrhea. Pertinent negatives include no chills and no sweats.    Past Medical History  Diagnosis Date  . Hypertension   . Bipolar 1 disorder   . Arthritis   . Migraine headache     History reviewed. No pertinent past surgical history.  Family History  Problem Relation Age of Onset  . Tuberculosis Mother   . Stroke Mother   . Tuberculosis Father   . Heart disease Father   . Bipolar disorder Sister   . Drug abuse Sister     History  Substance Use Topics  . Smoking status: Never Smoker   . Smokeless tobacco: Never Used  . Alcohol Use: Yes     occ    OB History    Grav Para Term Preterm Abortions TAB SAB Ect Mult Living                  Review of Systems  Constitutional: Negative for chills.  Gastrointestinal: Positive for vomiting, abdominal pain and diarrhea.  All other systems reviewed and are negative.    Allergies  Doxycycline; Flagyl; Penicillins; Shellfish allergy; and Sulfa antibiotics  Home Medications   Current Outpatient Rx  Name Route Sig Dispense Refill  . CLONIDINE HCL 0.1 MG PO TABS Oral Take 0.1 mg by mouth 3 (three) times daily.     Marland Kitchen FLUOXETINE HCL 20 MG PO CAPS Oral Take 40 mg by mouth 2 (two) times daily.       BP 130/74  Pulse 66  Temp(Src) 97.5 F (36.4 C) (Oral)  Resp 16  SpO2 99%  LMP 01/23/2011  Physical Exam  Nursing  note and vitals reviewed. Constitutional: She is oriented to person, place, and time. She appears well-developed and well-nourished. No distress.  HENT:  Head: Normocephalic and atraumatic.  Neck: Normal range of motion. Neck supple.  Cardiovascular: Normal rate and regular rhythm.  Exam reveals no gallop and no friction rub.   No murmur heard. Pulmonary/Chest: Effort normal and breath sounds normal. No respiratory distress. She has no wheezes.  Abdominal: Soft. Bowel sounds are normal. She exhibits no distension.       The abd is ttp in all four quadrants.  There is no rebound or guarding.  Musculoskeletal: Normal range of motion.  Neurological: She is alert and oriented to person, place, and time.  Skin: Skin is warm and dry. She is not diaphoretic.    ED Course  Procedures (including critical care time)  Labs Reviewed - No data to display No results found.   No diagnosis found.    MDM  The labs look okay and symptoms are consistent with gastroenteritis.  Will discharge to home with pain and nausea meds.          Geoffery Lyons, MD 03/18/11 [phone]

## 2011-03-18 NOTE — Discharge Instructions (Signed)

## 2011-03-26 ENCOUNTER — Emergency Department (HOSPITAL_COMMUNITY): Payer: Self-pay

## 2011-03-26 ENCOUNTER — Encounter (HOSPITAL_COMMUNITY): Payer: Self-pay | Admitting: *Deleted

## 2011-03-26 ENCOUNTER — Emergency Department (HOSPITAL_COMMUNITY)
Admission: EM | Admit: 2011-03-26 | Discharge: 2011-03-26 | Disposition: A | Discharge status: Home / Self Care | Payer: Self-pay | Attending: Emergency Medicine | Admitting: Emergency Medicine

## 2011-03-26 DIAGNOSIS — I10 Essential (primary) hypertension: Secondary | ICD-10-CM | POA: Insufficient documentation

## 2011-03-26 DIAGNOSIS — R109 Unspecified abdominal pain: Secondary | ICD-10-CM | POA: Insufficient documentation

## 2011-03-26 DIAGNOSIS — Z79899 Other long term (current) drug therapy: Secondary | ICD-10-CM | POA: Insufficient documentation

## 2011-03-26 DIAGNOSIS — F319 Bipolar disorder, unspecified: Secondary | ICD-10-CM | POA: Insufficient documentation

## 2011-03-26 DIAGNOSIS — R197 Diarrhea, unspecified: Secondary | ICD-10-CM | POA: Insufficient documentation

## 2011-03-26 DIAGNOSIS — M129 Arthropathy, unspecified: Secondary | ICD-10-CM | POA: Insufficient documentation

## 2011-03-26 DIAGNOSIS — R112 Nausea with vomiting, unspecified: Secondary | ICD-10-CM | POA: Insufficient documentation

## 2011-03-26 LAB — WET PREP, GENITAL: Trich, Wet Prep: NONE SEEN

## 2011-03-26 LAB — CBC
HCT: 36.5 % (ref 36.0–46.0)
MCHC: 33.7 g/dL (ref 30.0–36.0)
RDW: 13.2 % (ref 11.5–15.5)

## 2011-03-26 LAB — URINALYSIS, ROUTINE W REFLEX MICROSCOPIC
Ketones, ur: NEGATIVE mg/dL
Leukocytes, UA: NEGATIVE
Nitrite: NEGATIVE
Protein, ur: NEGATIVE mg/dL
Urobilinogen, UA: 0.2 mg/dL (ref 0.0–1.0)
pH: 5.5 (ref 5.0–8.0)

## 2011-03-26 LAB — BASIC METABOLIC PANEL
Chloride: 103 mEq/L (ref 96–112)
Creatinine, Ser: 0.69 mg/dL (ref 0.50–1.10)
GFR calc Af Amer: >90 mL/min (ref >90)
GFR calc non Af Amer: >90 mL/min (ref >90)

## 2011-03-26 LAB — URINE MICROSCOPIC-ADD ON

## 2011-03-26 LAB — DIFFERENTIAL
Basophils Absolute: 0 10*3/uL (ref 0.0–0.1)
Basophils Relative: 0 % (ref 0–1)
Monocytes Absolute: 0.8 10*3/uL (ref 0.1–1.0)
Neutro Abs: 5.3 10*3/uL (ref 1.7–7.7)
Neutrophils Relative %: 67 % (ref 43–77)

## 2011-03-26 LAB — POCT PREGNANCY, URINE: Preg Test, Ur: NEGATIVE

## 2011-03-26 MED ORDER — OXYCODONE-ACETAMINOPHEN 5-325 MG PO TABS: 1.0000 | ORAL_TABLET | Freq: Four times a day (QID) | ORAL | Status: AC | PRN

## 2011-03-26 MED ORDER — OXYCODONE-ACETAMINOPHEN 5-325 MG PO TABS
2.0000 | ORAL_TABLET | Freq: Once | ORAL | Status: AC
  Administered 2011-03-26: 2 via ORAL
  Filled 2011-03-26: qty 2

## 2011-03-26 MED ORDER — IOHEXOL 300 MG/ML  SOLN: 20.0000 mL | INTRAMUSCULAR | Status: AC

## 2011-03-26 MED ORDER — ONDANSETRON HCL 4 MG/2ML IJ SOLN
4.0000 mg | Freq: Once | INTRAMUSCULAR | Status: AC
Start: 2011-03-26 — End: 2011-03-26
  Administered 2011-03-26: 4 mg via INTRAVENOUS
  Filled 2011-03-26: qty 2

## 2011-03-26 MED ORDER — HYDROMORPHONE HCL PF 1 MG/ML IJ SOLN
1.0000 mg | Freq: Once | INTRAMUSCULAR | Status: AC
  Administered 2011-03-26: 1 mg via INTRAVENOUS
  Filled 2011-03-26: qty 1

## 2011-03-26 MED ORDER — SODIUM CHLORIDE 0.9 % IV BOLUS (SEPSIS)
1000.0000 mL | Freq: Once | INTRAVENOUS | Status: AC
  Administered 2011-03-26: 1000 mL via INTRAVENOUS

## 2011-03-26 MED ORDER — ONDANSETRON HCL 4 MG/2ML IJ SOLN
4.0000 mg | Freq: Once | INTRAMUSCULAR | Status: AC
  Administered 2011-03-26: 4 mg via INTRAVENOUS
  Filled 2011-03-26: qty 2

## 2011-03-26 MED ORDER — PROMETHAZINE HCL 25 MG PO TABS: 25.0000 mg | ORAL_TABLET | Freq: Four times a day (QID) | ORAL | Status: DC | PRN

## 2011-03-26 MED ORDER — PROMETHAZINE HCL 25 MG PO TABS
25.0000 mg | ORAL_TABLET | ORAL | Status: AC
  Administered 2011-03-26: 25 mg via ORAL
  Filled 2011-03-26: qty 1

## 2011-03-26 MED ORDER — IOHEXOL 300 MG/ML  SOLN
100.0000 mL | Freq: Once | INTRAMUSCULAR | Status: AC | PRN
  Administered 2011-03-26: 100 mL via INTRAVENOUS

## 2011-03-26 NOTE — ED Notes (Signed)
Patient report given to Diane, RN in CDU.  Patient moved to CDU 7.

## 2011-03-26 NOTE — ED Provider Notes (Signed)
History     CSN: [id number]  Arrival date & time 03/26/11  1632   First MD Initiated Contact with Patient 03/26/11 1739      Chief Complaint  Patient presents with  . Abdominal Pain  . Emesis  . Diarrhea    (Consider location/radiation/quality/duration/timing/severity/associated sxs/prior treatment) Patient is a 51 y.o. female presenting with abdominal pain, vomiting, and diarrhea.  Abdominal Pain The primary symptoms of the illness include abdominal pain, vomiting, diarrhea and vaginal discharge.  Additional symptoms associated with the illness include chills. Symptoms associated with the illness do not include heartburn, urgency, hematuria, frequency or back pain.  Emesis  Associated symptoms include abdominal pain, chills and diarrhea.  Diarrhea The primary symptoms include abdominal pain, vomiting and diarrhea.  The illness is also significant for chills. The illness does not include back pain.  Nausea vomiting x2 and diarrhea x 4 in the last 24 hours.  On her period presently denies fever but has had chills.  Abdominal pain is diffuse but RLQ especially. PCP is family [facility] family practice. Will hydrate, do a pelvic and treat her pain in the ER tonight.  Possible CT of the abdomen.  Past Medical History  Diagnosis Date  . Hypertension   . Bipolar 1 disorder   . Arthritis   . Migraine headache     History reviewed. No pertinent past surgical history.  Family History  Problem Relation Age of Onset  . Tuberculosis Mother   . Stroke Mother   . Tuberculosis Father   . Heart disease Father   . Bipolar disorder Sister   . Drug abuse Sister     History  Substance Use Topics  . Smoking status: Never Smoker   . Smokeless tobacco: Never Used  . Alcohol Use: Yes     occ    OB History    Grav Para Term Preterm Abortions TAB SAB Ect Mult Living                  Review of Systems  Constitutional: Positive for chills.  Gastrointestinal: Positive for vomiting,  abdominal pain and diarrhea. Negative for heartburn.  Genitourinary: Positive for vaginal discharge. Negative for urgency, frequency and hematuria.  Musculoskeletal: Negative for back pain.    Allergies  Doxycycline; Flagyl; Penicillins; Shellfish allergy; and Sulfa antibiotics  Home Medications   Current Outpatient Rx  Name Route Sig Dispense Refill  . CLONIDINE HCL 0.1 MG PO TABS Oral Take 0.1 mg by mouth 3 (three) times daily.     Marland Kitchen FLUOXETINE HCL 20 MG PO CAPS Oral Take 40 mg by mouth daily.     Marland Kitchen LAMOTRIGINE 25 MG PO TABS Oral Take 25 mg by mouth daily.      BP 133/80  Pulse 72  Temp(Src) 97.6 F (36.4 C) (Oral)  Resp 12  SpO2 96%  Physical Exam  Nursing note and vitals reviewed. Constitutional: She is oriented to person, place, and time. She appears well-developed and well-nourished.  HENT:  Head: Normocephalic and atraumatic.  Eyes: Conjunctivae and EOM are normal. Pupils are equal, round, and reactive to light.  Neck: Normal range of motion. Neck supple.  Cardiovascular: Normal rate, regular rhythm, normal heart sounds and intact distal pulses.  Exam reveals no gallop and no friction rub.   No murmur heard. Pulmonary/Chest: Effort normal and breath sounds normal.  Abdominal: Soft. Bowel sounds are normal.  Genitourinary: Uterus normal. There is no tenderness on the right labia. There is no tenderness on the left  labia. There is bleeding around the vagina. No vaginal discharge found.  Musculoskeletal: Normal range of motion. She exhibits no edema and no tenderness.  Neurological: She is alert and oriented to person, place, and time. She has normal reflexes.  Skin: Skin is warm and dry.  Psychiatric: She has a normal mood and affect.    ED Course  Procedures (including critical care time)  Labs Reviewed  BASIC METABOLIC PANEL - Abnormal; Notable for the following:    Potassium 3.4 (*)    All other components within normal limits  URINALYSIS, ROUTINE W REFLEX  MICROSCOPIC - Abnormal; Notable for the following:    Hgb urine dipstick MODERATE (*)    All other components within normal limits  WET PREP, GENITAL - Abnormal; Notable for the following:    WBC, Wet Prep HPF POC FEW (*)    All other components within normal limits  URINE MICROSCOPIC-ADD ON - Abnormal; Notable for the following:    Squamous Epithelial / LPF FEW (*)    All other components within normal limits  CBC  DIFFERENTIAL  LIPASE, BLOOD  POCT PREGNANCY, URINE  GC/CHLAMYDIA PROBE AMP, GENITAL   No results found.   No diagnosis found.    MDM  51yo female with nausea, vomiting and diffuse abdominal pain x 12 - 16 hours.  On her period with no visible vaginal discharge or CMT. CT negative for acute process.  rx for pain meds and nausea.  Follow up at [facility] next week or return if not better.  Labs Reviewed  BASIC METABOLIC PANEL - Abnormal; Notable for the following:    Potassium 3.4 (*)    All other components within normal limits  URINALYSIS, ROUTINE W REFLEX MICROSCOPIC - Abnormal; Notable for the following:    Hgb urine dipstick MODERATE (*)    All other components within normal limits  WET PREP, GENITAL - Abnormal; Notable for the following:    WBC, Wet Prep HPF POC FEW (*)    All other components within normal limits  URINE MICROSCOPIC-ADD ON - Abnormal; Notable for the following:    Squamous Epithelial / LPF FEW (*)    All other components within normal limits  CBC  DIFFERENTIAL  LIPASE, BLOOD  POCT PREGNANCY, URINE  GC/CHLAMYDIA PROBE AMP, GENITAL          Jethro Bastos, NP 03/27/11 1409

## 2011-03-26 NOTE — ED Notes (Signed)
Reports lower abd pain that started last night with n/v/d. No acute distress noted at triage.

## 2011-03-26 NOTE — ED Notes (Signed)
Patient complaining of nausea and abdominal pain.  MD notified.

## 2011-03-26 NOTE — ED Notes (Signed)
Pt advised she is finished drinking PO contrast. Called and informed CT.

## 2011-03-26 NOTE — ED Notes (Signed)
PT provided with beverage upon request

## 2011-03-26 NOTE — ED Notes (Signed)
PT states pain in abdomen. 9 on pain scale

## 2011-03-26 NOTE — ED Notes (Signed)
Pt received to POD 10 with c/o abdominal pain onset yesterday with diarrhea, and nausea. Pt denies any fever. Pt is NAD, family is at the bedside

## 2011-03-26 NOTE — Discharge Instructions (Signed)
Abdominal Pain Abdominal pain can be caused by many things. Your caregiver decides the seriousness of your pain by an examination and possibly blood tests and X-rays. Many cases can be observed and treated at home. Most abdominal pain is not caused by a disease and will probably improve without treatment. However, in many cases, more time must pass before a clear cause of the pain can be found. Before that point, it may not be known if you need more testing, or if hospitalization or surgery is needed. HOME CARE INSTRUCTIONS   Do not take laxatives unless directed by your caregiver.   Take pain medicine only as directed by your caregiver.   Only take over-the-counter or prescription medicines for pain, discomfort, or fever as directed by your caregiver.   Try a clear liquid diet (broth, tea, or water) for as long as directed by your caregiver. Slowly move to a bland diet as tolerated.  SEEK IMMEDIATE MEDICAL CARE IF:   The pain does not go away.   You have a fever.   You keep throwing up (vomiting).   The pain is felt only in portions of the abdomen. Pain in the right side could possibly be appendicitis. In an adult, pain in the left lower portion of the abdomen could be colitis or diverticulitis.   You pass bloody or black tarry stools.  MAKE SURE YOU:   Understand these instructions.   Will watch your condition.   Will get help right away if you are not doing well or get worse.  Document Released: 10/27/2004 Document Revised: 09/29/2010 Document Reviewed: 09/05/2007 [facility] Patient Information 2012 [place], [place].            Nausea and Vomiting Nausea is a sick feeling that often comes before throwing up (vomiting). Vomiting is a reflex where stomach contents come out of your mouth. Vomiting can cause severe loss of body fluids (dehydration). Children and elderly adults can become dehydrated quickly, especially if they also have diarrhea. Nausea and vomiting are symptoms  of a condition or disease. It is important to find the cause of your symptoms. CAUSES   Direct irritation of the stomach lining. This irritation can result from increased acid production (gastroesophageal reflux disease), infection, food poisoning, taking certain medicines (such as nonsteroidal anti-inflammatory drugs), alcohol use, or tobacco use.   Signals from the brain.These signals could be caused by a headache, heat exposure, an inner ear disturbance, increased pressure in the brain from injury, infection, a tumor, or a concussion, pain, emotional stimulus, or metabolic problems.   An obstruction in the gastrointestinal tract (bowel obstruction).   Illnesses such as diabetes, hepatitis, gallbladder problems, appendicitis, kidney problems, cancer, sepsis, atypical symptoms of a heart attack, or eating disorders.   Medical treatments such as chemotherapy and radiation.   Receiving medicine that makes you sleep (general anesthetic) during surgery.  DIAGNOSIS Your caregiver may ask for tests to be done if the problems do not improve after a few days. Tests may also be done if symptoms are severe or if the reason for the nausea and vomiting is not clear. Tests may include:  Urine tests.   Blood tests.   Stool tests.   Cultures (to look for evidence of infection).   X-rays or other imaging studies.  Test results can help your caregiver make decisions about treatment or the need for additional tests. TREATMENT You need to stay well hydrated. Drink frequently but in small amounts.You may wish to drink water, sports drinks, clear  broth, or eat frozen ice pops or gelatin dessert to help stay hydrated.When you eat, eating slowly may help prevent nausea.There are also some antinausea medicines that may help prevent nausea. HOME CARE INSTRUCTIONS   Take all medicine as directed by your caregiver.   If you do not have an appetite, do not force yourself to eat. However, you must continue  to drink fluids.   If you have an appetite, eat a normal diet unless your caregiver tells you differently.   Eat a variety of complex carbohydrates (rice, wheat, potatoes, bread), lean meats, yogurt, fruits, and vegetables.   Avoid high-fat foods because they are more difficult to digest.   Drink enough water and fluids to keep your urine clear or pale yellow.   If you are dehydrated, ask your caregiver for specific rehydration instructions. Signs of dehydration may include:   Severe thirst.   Dry lips and mouth.   Dizziness.   Dark urine.   Decreasing urine frequency and amount.   Confusion.   Rapid breathing or pulse.  SEEK IMMEDIATE MEDICAL CARE IF:   You have blood or brown flecks (like coffee grounds) in your vomit.   You have black or bloody stools.   You have a severe headache or stiff neck.   You are confused.   You have severe abdominal pain.   You have chest pain or trouble breathing.   You do not urinate at least once every 8 hours.   You develop cold or clammy skin.   You continue to vomit for longer than 24 to 48 hours.   You have a fever.  MAKE SURE YOU:   Understand these instructions.   Will watch your condition.   Will get help right away if you are not doing well or get worse.  Document Released: 01/17/2005 Document Revised: 09/29/2010 Document Reviewed: 06/16/2010 [facility] Patient Information 2012 [place], [place].         Diarrhea Infections caused by germs (bacterial) or a virus commonly cause diarrhea. Your caregiver has determined that with time, rest and fluids, the diarrhea should improve. In general, eat normally while drinking more water than usual. Although water may prevent dehydration, it does not contain salt and minerals (electrolytes). Broths, weak tea without caffeine and oral rehydration solutions (ORS) replace fluids and electrolytes. Small amounts of fluids should be taken frequently. Large amounts at one time may  not be tolerated. Plain water may be harmful in infants and the elderly. Oral rehydrating solutions (ORS) are available at pharmacies and grocery stores. ORS replace water and important electrolytes in proper proportions. Sports drinks are not as effective as ORS and may be harmful due to sugars worsening diarrhea.  ORS is especially recommended for use in children with diarrhea. As a general guideline for children, replace any new fluid losses from diarrhea and/or vomiting with ORS as follows:   If your child weighs 22 pounds or under (10 kg or less), give 60-120 mL ( -  cup or 2 - 4 ounces) of ORS for each episode of diarrheal stool or vomiting episode.   If your child weighs more than 22 pounds (more than 10 kgs), give 120-240 mL ( - 1 cup or 4 - 8 ounces) of ORS for each diarrheal stool or episode of vomiting.   While correcting for dehydration, children should eat normally. However, foods high in sugar should be avoided because this may worsen diarrhea. Large amounts of carbonated soft drinks, juice, gelatin desserts and other highly sugared  drinks should be avoided.   After correction of dehydration, other liquids that are appealing to the child may be added. Children should drink small amounts of fluids frequently and fluids should be increased as tolerated. Children should drink enough fluids to keep urine clear or pale yellow.   Adults should eat normally while drinking more fluids than usual. Drink small amounts of fluids frequently and increase as tolerated. Drink enough fluids to keep urine clear or pale yellow. Broths, weak decaffeinated tea, lemon lime soft drinks (allowed to go flat) and ORS replace fluids and electrolytes.   Avoid:   Carbonated drinks.   Juice.   Extremely hot or cold fluids.   Caffeine drinks.   Fatty, greasy foods.   Alcohol.   Tobacco.   Too much intake of anything at one time.   Gelatin desserts.   Probiotics are active cultures of beneficial  bacteria. They may lessen the amount and number of diarrheal stools in adults. Probiotics can be found in yogurt with active cultures and in supplements.   Wash hands well to avoid spreading bacteria and virus.   Anti-diarrheal medications are not recommended for infants and children.   Only take over-the-counter or prescription medicines for pain, discomfort or fever as directed by your caregiver. Do not give aspirin to children because it may cause Reye's Syndrome.   For adults, ask your caregiver if you should continue all prescribed and over-the-counter medicines.   If your caregiver has given you a follow-up appointment, it is very important to keep that appointment. Not keeping the appointment could result in a chronic or permanent injury, and disability. If there is any problem keeping the appointment, you must call back to this facility for assistance.  SEEK IMMEDIATE MEDICAL CARE IF:   You or your child is unable to keep fluids down or other symptoms or problems become worse in spite of treatment.   Vomiting or diarrhea develops and becomes persistent.   There is vomiting of blood or bile (green material).   There is blood in the stool or the stools are black and tarry.   There is no urine output in 6-8 hours or there is only a small amount of very dark urine.   Abdominal pain develops, increases or localizes.   You have a fever.   Your baby is older than 3 months with a rectal temperature of 102 F (38.9 C) or higher.   Your baby is 60 months old or younger with a rectal temperature of 100.4 F (38 C) or higher.   You or your child develops excessive weakness, dizziness, fainting or extreme thirst.   You or your child develops a rash, stiff neck, severe headache or become irritable or sleepy and difficult to awaken.  MAKE SURE YOU:   Understand these instructions.   Will watch your condition.   Will get help right away if you are not doing well or get worse.    Document Released: 01/07/2002 Document Revised: 09/29/2010 Document Reviewed: 11/24/2008 [facility] Patient Information 2012 [place], [place].

## 2011-03-26 NOTE — ED Provider Notes (Signed)
9:30 PM Patient to the CDU while awaiting a CT scan of the abdomen and pelvis for further evaluation of lower abdominal pain with associated nausea, vomiting, diarrhea since last night. Vital signs stable. On my assessment, she is lying on a stretcher, awake, alert, oriented, no acute distress. Lungs clear to auscultation bilaterally. Heart regular rate and rhythm. Abdomen soft, mild lower abdominal tenderness to palpation with no focal tenderness. Bowel sounds are normal. There is no rebound tenderness or guarding. Patient moves all extremities well.    11:14 PM CT scan has been reviewed, no acute findings. This has been discussed with the patient, who reports a return of her abdominal pain. No continued episodes of vomiting or diarrhea in the CDU. Patient requests additional pain and nausea medication prior to discharge as she does not have the money to have her prescriptions filled this evening. These have been ordered and she will be discharged home. Advised to return if any new or changing symptoms.  Shaaron Adler, [place] 03/26/11 2316

## 2011-03-27 NOTE — ED Provider Notes (Signed)
Medical screening examination/treatment/procedure(s) were performed by non-physician practitioner and as supervising physician I was immediately available for consultation/collaboration.  Cyndra Numbers, MD 03/27/11 [phone]

## 2011-03-27 NOTE — ED Provider Notes (Signed)
Medical screening examination/treatment/procedure(s) were performed by non-physician practitioner and as supervising physician I was immediately available for consultation/collaboration.  Kyan Yurkovich, MD 03/27/11 1644 

## 2011-03-28 LAB — GC/CHLAMYDIA PROBE AMP, GENITAL: Chlamydia, DNA Probe: NEGATIVE

## 2011-04-11 ENCOUNTER — Encounter (HOSPITAL_COMMUNITY): Payer: Self-pay | Admitting: Emergency Medicine

## 2011-04-11 ENCOUNTER — Emergency Department (HOSPITAL_COMMUNITY): Payer: Self-pay

## 2011-04-11 ENCOUNTER — Inpatient Hospital Stay (HOSPITAL_COMMUNITY)
Admission: EM | Admit: 2011-04-11 | Discharge: 2011-04-13 | DRG: 392 | Disposition: A | Discharge status: Home / Self Care | Payer: Self-pay | Attending: Internal Medicine | Admitting: Internal Medicine

## 2011-04-11 DIAGNOSIS — G43909 Migraine, unspecified, not intractable, without status migrainosus: Secondary | ICD-10-CM | POA: Diagnosis present

## 2011-04-11 DIAGNOSIS — I10 Essential (primary) hypertension: Secondary | ICD-10-CM | POA: Diagnosis present

## 2011-04-11 DIAGNOSIS — J440 Chronic obstructive pulmonary disease with acute lower respiratory infection: Secondary | ICD-10-CM | POA: Diagnosis present

## 2011-04-11 DIAGNOSIS — J209 Acute bronchitis, unspecified: Secondary | ICD-10-CM | POA: Diagnosis present

## 2011-04-11 DIAGNOSIS — K219 Gastro-esophageal reflux disease without esophagitis: Secondary | ICD-10-CM | POA: Diagnosis present

## 2011-04-11 DIAGNOSIS — R109 Unspecified abdominal pain: Principal | ICD-10-CM | POA: Diagnosis present

## 2011-04-11 DIAGNOSIS — R296 Repeated falls: Secondary | ICD-10-CM | POA: Diagnosis present

## 2011-04-11 DIAGNOSIS — M129 Arthropathy, unspecified: Secondary | ICD-10-CM | POA: Diagnosis present

## 2011-04-11 DIAGNOSIS — J40 Bronchitis, not specified as acute or chronic: Secondary | ICD-10-CM | POA: Diagnosis present

## 2011-04-11 DIAGNOSIS — R918 Other nonspecific abnormal finding of lung field: Secondary | ICD-10-CM | POA: Diagnosis present

## 2011-04-11 DIAGNOSIS — F17200 Nicotine dependence, unspecified, uncomplicated: Secondary | ICD-10-CM | POA: Diagnosis present

## 2011-04-11 DIAGNOSIS — F319 Bipolar disorder, unspecified: Secondary | ICD-10-CM | POA: Diagnosis present

## 2011-04-11 DIAGNOSIS — R112 Nausea with vomiting, unspecified: Secondary | ICD-10-CM | POA: Diagnosis present

## 2011-04-11 DIAGNOSIS — Z9181 History of falling: Secondary | ICD-10-CM

## 2011-04-11 HISTORY — DX: Gastro-esophageal reflux disease without esophagitis: K21.9

## 2011-04-11 LAB — DIFFERENTIAL
Eosinophils Relative: 3 % (ref 0–5)
Lymphocytes Relative: 22 % (ref 12–46)
Lymphs Abs: 1.8 10*3/uL (ref 0.7–4.0)
Neutrophils Relative %: 65 % (ref 43–77)

## 2011-04-11 LAB — POCT I-STAT, CHEM 8
BUN: 10 mg/dL (ref 6–23)
Creatinine, Ser: 0.7 mg/dL (ref 0.50–1.10)
Potassium: 4.3 mEq/L (ref 3.5–5.1)
Sodium: 140 mEq/L (ref 135–145)

## 2011-04-11 LAB — URINE MICROSCOPIC-ADD ON

## 2011-04-11 LAB — URINALYSIS, ROUTINE W REFLEX MICROSCOPIC
Glucose, UA: NEGATIVE mg/dL
Specific Gravity, Urine: 1.028 (ref 1.005–1.030)
Urobilinogen, UA: 0.2 mg/dL (ref 0.0–1.0)
pH: 5.5 (ref 5.0–8.0)

## 2011-04-11 LAB — CBC
Hemoglobin: 13 g/dL (ref 12.0–15.0)
MCV: 92.4 fL (ref 78.0–100.0)
Platelets: 267 10*3/uL (ref 150–400)
RBC: 4.19 MIL/uL (ref 3.87–5.11)
WBC: 8.1 10*3/uL (ref 4.0–10.5)

## 2011-04-11 LAB — HEPATIC FUNCTION PANEL: Total Protein: 6.4 g/dL (ref 6.0–8.3)

## 2011-04-11 LAB — LIPASE, BLOOD: Lipase: 23 U/L (ref 11–59)

## 2011-04-11 MED ORDER — ACETAMINOPHEN 650 MG RE SUPP: 650.0000 mg | Freq: Four times a day (QID) | RECTAL | Status: DC | PRN

## 2011-04-11 MED ORDER — FENTANYL CITRATE 0.05 MG/ML IJ SOLN
50.0000 ug | Freq: Once | INTRAMUSCULAR | Status: AC
  Administered 2011-04-11 (×2): 50 ug via INTRAVENOUS

## 2011-04-11 MED ORDER — FENTANYL CITRATE 0.05 MG/ML IJ SOLN
INTRAMUSCULAR | Status: AC
  Filled 2011-04-11: qty 2

## 2011-04-11 MED ORDER — FLUOXETINE HCL 20 MG PO CAPS
40.0000 mg | ORAL_CAPSULE | Freq: Every day | ORAL | Status: DC
  Administered 2011-04-12 – 2011-04-13 (×2): 40 mg via ORAL
  Filled 2011-04-11 (×2): qty 2

## 2011-04-11 MED ORDER — HYDROMORPHONE HCL PF 1 MG/ML IJ SOLN
1.0000 mg | Freq: Once | INTRAMUSCULAR | Status: AC
  Administered 2011-04-11: 1 mg via INTRAVENOUS
  Filled 2011-04-11: qty 1

## 2011-04-11 MED ORDER — FENTANYL CITRATE 0.05 MG/ML IJ SOLN
INTRAMUSCULAR | Status: AC
  Administered 2011-04-11: 50 ug via INTRAVENOUS
  Filled 2011-04-11: qty 2

## 2011-04-11 MED ORDER — HYDRALAZINE HCL 20 MG/ML IJ SOLN
10.0000 mg | INTRAMUSCULAR | Status: DC | PRN
  Filled 2011-04-11: qty 0.5

## 2011-04-11 MED ORDER — CLONIDINE HCL 0.1 MG PO TABS
0.1000 mg | ORAL_TABLET | Freq: Three times a day (TID) | ORAL | Status: DC
Start: 2011-04-11 — End: 2011-04-13
  Administered 2011-04-12 – 2011-04-13 (×5): 0.1 mg via ORAL
  Filled 2011-04-11 (×7): qty 1

## 2011-04-11 MED ORDER — SODIUM CHLORIDE 0.9 % IV BOLUS (SEPSIS)
500.0000 mL | Freq: Once | INTRAVENOUS | Status: AC
  Administered 2011-04-11: 500 mL via INTRAVENOUS

## 2011-04-11 MED ORDER — ONDANSETRON HCL 4 MG/2ML IJ SOLN
4.0000 mg | Freq: Four times a day (QID) | INTRAMUSCULAR | Status: DC | PRN
  Administered 2011-04-12 – 2011-04-13 (×3): 4 mg via INTRAVENOUS
  Filled 2011-04-11 (×3): qty 2

## 2011-04-11 MED ORDER — ONDANSETRON HCL 4 MG PO TABS: 4.0000 mg | ORAL_TABLET | Freq: Four times a day (QID) | ORAL | Status: DC | PRN

## 2011-04-11 MED ORDER — ONDANSETRON HCL 4 MG/2ML IJ SOLN
4.0000 mg | Freq: Once | INTRAMUSCULAR | Status: AC
  Administered 2011-04-11: 4 mg via INTRAVENOUS
  Filled 2011-04-11: qty 2

## 2011-04-11 MED ORDER — ACETAMINOPHEN 325 MG PO TABS: 650.0000 mg | ORAL_TABLET | Freq: Four times a day (QID) | ORAL | Status: DC | PRN

## 2011-04-11 MED ORDER — HYDROMORPHONE HCL PF 1 MG/ML IJ SOLN
0.5000 mg | INTRAMUSCULAR | Status: DC | PRN
  Administered 2011-04-11 – 2011-04-12 (×3): 0.5 mg via INTRAVENOUS
  Filled 2011-04-11 (×3): qty 1

## 2011-04-11 MED ORDER — ONDANSETRON HCL 4 MG/2ML IJ SOLN: 4.0000 mg | Freq: Once | INTRAMUSCULAR | Status: DC

## 2011-04-11 MED ORDER — ONDANSETRON HCL 4 MG/2ML IJ SOLN
INTRAMUSCULAR | Status: AC
Start: 2011-04-11 — End: 2011-04-12
  Filled 2011-04-11: qty 2

## 2011-04-11 MED ORDER — FENTANYL CITRATE 0.05 MG/ML IJ SOLN
50.0000 ug | Freq: Once | INTRAMUSCULAR | Status: AC
  Administered 2011-04-11: 50 ug via INTRAVENOUS
  Filled 2011-04-11: qty 2

## 2011-04-11 MED ORDER — LAMOTRIGINE 25 MG PO TABS
25.0000 mg | ORAL_TABLET | Freq: Every day | ORAL | Status: DC
  Administered 2011-04-12 – 2011-04-13 (×2): 25 mg via ORAL
  Filled 2011-04-11 (×2): qty 1

## 2011-04-11 MED ORDER — SODIUM CHLORIDE 0.9 % IV SOLN
INTRAVENOUS | Status: DC
  Administered 2011-04-11 – 2011-04-12 (×2): via INTRAVENOUS

## 2011-04-11 NOTE — ED Notes (Signed)
Admitting orders in chart; no NOW orders to be completed.  Preparing patient for transport; patient being transported by nurse tech.

## 2011-04-11 NOTE — ED Provider Notes (Signed)
History     CSN: [id number]  Arrival date & time 04/11/11  1214   First MD Initiated Contact with Patient 04/11/11 1618      Chief Complaint  Patient presents with  . Abdominal Pain    (Consider location/radiation/quality/duration/timing/severity/associated sxs/prior treatment) Patient is a 51 y.o. female presenting with abdominal pain. The history is provided by the patient.  Abdominal Pain The primary symptoms of the illness include abdominal pain, nausea, vomiting and diarrhea. The primary symptoms of the illness do not include shortness of breath.  Symptoms associated with the illness do not include back pain.   patient has had nausea vomiting diarrhea with abdominal pain the last couple days. She's also had abdominal pain for the last few months. She's had a negative CT under a month ago. She's also had a cough. She states his been some production for the cough. No dysuria. She states she's felt dizzy to. No fevers. No chest pain. The abdominal pain is in the upper abdomen.  Past Medical History  Diagnosis Date  . Hypertension   . Bipolar 1 disorder   . Arthritis   . Migraine headache   . Acid reflux     No past surgical history on file.  Family History  Problem Relation Age of Onset  . Tuberculosis Mother   . Stroke Mother   . Tuberculosis Father   . Heart disease Father   . Bipolar disorder Sister   . Drug abuse Sister     History  Substance Use Topics  . Smoking status: Current Everyday Smoker  . Smokeless tobacco: Never Used  . Alcohol Use: Yes     occ    OB History    Grav Para Term Preterm Abortions TAB SAB Ect Mult Living                  Review of Systems  Constitutional: Negative for activity change and appetite change.  HENT: Negative for neck stiffness.   Eyes: Negative for pain.  Respiratory: Positive for cough. Negative for chest tightness and shortness of breath.   Cardiovascular: Negative for chest pain and leg swelling.    Gastrointestinal: Positive for nausea, vomiting, abdominal pain and diarrhea.  Genitourinary: Negative for flank pain.  Musculoskeletal: Negative for back pain.  Skin: Negative for rash.  Neurological: Negative for weakness, numbness and headaches.  Psychiatric/Behavioral: Negative for behavioral problems.    Allergies  Doxycycline; Flagyl; Penicillins; Shellfish allergy; and Sulfa antibiotics  Home Medications   Current Outpatient Rx  Name Route Sig Dispense Refill  . CLONIDINE HCL 0.1 MG PO TABS Oral Take 0.1 mg by mouth 3 (three) times daily.     Marland Kitchen FLUOXETINE HCL 20 MG PO CAPS Oral Take 40 mg by mouth daily.     Marland Kitchen LAMOTRIGINE 25 MG PO TABS Oral Take 25 mg by mouth daily.      BP 123/81  Pulse 91  Temp(Src) 98.6 F (37 C) (Oral)  Resp 16  SpO2 98%  Physical Exam  Nursing note and vitals reviewed. Constitutional: She is oriented to person, place, and time. She appears well-developed and well-nourished.  HENT:  Head: Normocephalic and atraumatic.  Eyes: EOM are normal. Pupils are equal, round, and reactive to light.  Neck: Normal range of motion. Neck supple.  Cardiovascular: Normal rate, regular rhythm and normal heart sounds.   No murmur heard. Pulmonary/Chest: Effort normal. No respiratory distress. She has wheezes. She has no rales.       Diffuse harsh  breath sounds with some scattered rails in the bases  Abdominal: Soft. Bowel sounds are normal. She exhibits no distension. There is tenderness. There is no rebound and no guarding.       Mild upper abdominal tenderness without rebound or guarding  Musculoskeletal: Normal range of motion.  Neurological: She is alert and oriented to person, place, and time. No cranial nerve deficit.  Skin: Skin is warm and dry.  Psychiatric: She has a normal mood and affect. Her speech is normal.    ED Course  Procedures (including critical care time)  Labs Reviewed  URINALYSIS, ROUTINE W REFLEX MICROSCOPIC - Abnormal; Notable for  the following:    Hgb urine dipstick SMALL (*)    All other components within normal limits  URINE MICROSCOPIC-ADD ON - Abnormal; Notable for the following:    Squamous Epithelial / LPF FEW (*)    Bacteria, UA FEW (*)    All other components within normal limits  CBC  DIFFERENTIAL  POCT I-STAT, CHEM 8  HEPATIC FUNCTION PANEL  LIPASE, BLOOD  LACTIC ACID, PLASMA   Dg Chest 2 View  04/11/2011  *RADIOLOGY REPORT*  Clinical Data: Cough, smoker, bronchitis  CHEST - 2 VIEW  Comparison: 02/27/2011  Findings: Cardiomediastinal silhouette is stable.  No acute infiltrate or pulmonary edema.  Central mild bronchitic changes are noted.  Again noted nodular density projecting over the mid thoracic spine in the lateral view.  Again further evaluation with CT scan of the chest is recommended to exclude a lung nodule.  IMPRESSION:  No acute infiltrate or pulmonary edema.  Central mild bronchitic changes are noted.  Again noted nodular density projecting over the mid thoracic spine in the lateral view.  Again further evaluation with CT scan of the chest is recommended to exclude a lung nodule.  Original Report Authenticated By: Natasha Mead, M.D.     1. Abdominal pain       MDM  Abdominal pain for last few months. Previous CT did not show clear cause. Chest x-ray now does show a mass that will need a CT scan. Lab work is reassuring. She continues to be in pain when not tolerated orals. She'll be admitted to medicine for further evaluation. I discussed with Dr.Kakrakandy, who admitted the patient and put in some lab and imaging requests.        Juliet Rude. Rubin Payor, MD 04/11/11 2013

## 2011-04-11 NOTE — ED Notes (Addendum)
Skin w/d, resp e/u. Masked in triage waiting room. Aware of poc

## 2011-04-11 NOTE — ED Notes (Signed)
Pt returned from xray

## 2011-04-11 NOTE — ED Notes (Signed)
Patient currently sitting up in bed; no respiratory or acute distress noted.  Patient updated on plan of care; informed patient that report has been called and that we are currently waiting for admission orders from admitting MD.  Will ask EDP about temporary admission orders.  Patient has no other questions or concerns at this time; will continue to monitor.

## 2011-04-11 NOTE — ED Provider Notes (Deleted)
  Physical Exam  BP 123/81  Pulse 91  Temp(Src) 98.6 F (37 C) (Oral)  Resp 16  SpO2 98%  Physical Exam  ED Course  Procedures  MDM Nausea vomiting diarrhea. Also cough with some harsh breath sounds. States she's felt bad for 2 days. She seen about 3 months ago for nausea vomiting diarrhea abdominal pain. She had negative CT at that time. Again x-ray of the basic blood work and given IV fluids and anti-emetics.      Juliet Rude. Rubin Payor, MD 04/11/11 1627

## 2011-04-11 NOTE — ED Notes (Signed)
Pt calling out on call bell multiple times stating she is in pain. Dr. Rubin Payor aware, wants patient moved to Pod. NNO at this time.

## 2011-04-11 NOTE — H&P (Signed)
Alexandra Fitzgerald is an 51 y.o. Fitzgerald.   PCP - None.  Chief Complaint: Abdominal pain. HPI: Alexandra Fitzgerald with history of hypertension bipolar disorder has increasing abdominal pain over the last month which has been recurrent. Alexandra Fitzgerald did come to the ER 3 weeks ago and has had a CAT scan of the abdomen and pelvis which was negative for anything acute. Last 3 days Alexandra Fitzgerald started having the pain again which is usually in the lower quadrants and it's been persistent with nausea vomiting. Denies any diarrhea. Denies any blood in the vomitus. Despite giving pain relief medications Alexandra Fitzgerald still is complaining of pain. Alexandra Fitzgerald will be admitted for further observation. Alexandra Fitzgerald denies any chest pain shortness of breath any dizziness loss of consciousness or focal deficits.  Past Medical History  Diagnosis Date  . Hypertension   . Bipolar 1 disorder   . Arthritis   . Migraine headache   . Acid reflux     History reviewed. No pertinent past surgical history.  Family History  Problem Relation Age of Onset  . Tuberculosis Mother   . Stroke Mother   . Tuberculosis Father   . Heart disease Father   . Bipolar disorder Sister   . Drug abuse Sister    Social History:  reports that she has been smoking.  She has never used smokeless tobacco. She reports that she drinks alcohol. She reports that she does not use illicit drugs.  Allergies:  Allergies  Allergen Reactions  . Doxycycline Rash    Also pain in face  . Flagyl (Metronidazole) Rash    Also pain in face  . Penicillins Rash  . Shellfish Allergy Rash  . Sulfa Antibiotics Rash    Medications Prior to Admission  Medication Dose Route Frequency Provider Last Rate Last Dose  . fentaNYL (SUBLIMAZE) injection 50 mcg  50 mcg Intravenous Once [employer]. Rubin Payor, MD   50 mcg at 04/11/11 1748  . fentaNYL (SUBLIMAZE) injection 50 mcg  50 mcg Intravenous Once [employer]. Rubin Payor, MD   50 mcg at 04/11/11 2026  . HYDROmorphone (DILAUDID)  injection 1 mg  1 mg Intravenous Once Nathan R. Pickering, MD   1 mg at 04/11/11 1938  . ondansetron (ZOFRAN) injection 4 mg  4 mg Intravenous Once [employer]. Pickering, MD   4 mg at 04/11/11 1646  . ondansetron (ZOFRAN) injection 4 mg  4 mg Intravenous Once [employer]. Pickering, MD      . sodium chloride 0.9 % bolus 500 mL  500 mL Intravenous Once Harrold Donath R. Pickering, MD   500 mL at 04/11/11 1646   Medications Prior to Admission  Medication Sig Dispense Refill  . cloNIDine (CATAPRES) 0.1 MG tablet Take 0.1 mg by mouth 3 (three) times daily.       Marland Kitchen FLUoxetine (PROZAC) 20 MG capsule Take 40 mg by mouth daily.       Marland Kitchen lamoTRIgine (LAMICTAL) 25 MG tablet Take 25 mg by mouth daily.        Results for orders placed during the hospital encounter of 04/11/11 (from the past 48 hour(s))  URINALYSIS, ROUTINE W REFLEX MICROSCOPIC     Status: Abnormal   Collection Time   04/11/11  2:12 PM      Component Value Range Comment   Color, Urine YELLOW  YELLOW     APPearance CLEAR  CLEAR     Specific Gravity, Urine 1.028  1.005 - 1.030     pH 5.5  5.0 - 8.0  Glucose, UA NEGATIVE  NEGATIVE (mg/dL)    Hgb urine dipstick SMALL (*) NEGATIVE     Bilirubin Urine NEGATIVE  NEGATIVE     Ketones, ur NEGATIVE  NEGATIVE (mg/dL)    Protein, ur NEGATIVE  NEGATIVE (mg/dL)    Urobilinogen, UA 0.2  0.0 - 1.0 (mg/dL)    Nitrite NEGATIVE  NEGATIVE     Leukocytes, UA NEGATIVE  NEGATIVE    URINE MICROSCOPIC-ADD ON     Status: Abnormal   Collection Time   04/11/11  2:12 PM      Component Value Range Comment   Squamous Epithelial / LPF FEW (*) RARE     WBC, UA 0-2  <3 (WBC/hpf)    RBC / HPF 0-2  <3 (RBC/hpf)    Bacteria, UA FEW (*) RARE     Urine-Other MUCOUS PRESENT     CBC     Status: Normal   Collection Time   04/11/11  4:26 PM      Component Value Range Comment   WBC 8.1  4.0 - 10.5 (K/uL)    RBC 4.19  3.87 - 5.11 (MIL/uL)    Hemoglobin 13.0  12.0 - 15.0 (g/dL)    HCT 46.9  62.9 - 52.8 (%)    MCV 92.4  78.0 -  100.0 (fL)    MCH 31.0  26.0 - 34.0 (pg)    MCHC 33.6  30.0 - 36.0 (g/dL)    RDW 41.3  24.4 - 01.0 (%)    Platelets 267  150 - 400 (K/uL)   DIFFERENTIAL     Status: Normal   Collection Time   04/11/11  4:26 PM      Component Value Range Comment   Neutrophils Relative 65  43 - 77 (%)    Neutro Abs 5.3  1.7 - 7.7 (K/uL)    Lymphocytes Relative 22  12 - 46 (%)    Lymphs Abs 1.8  0.7 - 4.0 (K/uL)    Monocytes Relative 10  3 - 12 (%)    Monocytes Absolute 0.8  0.1 - 1.0 (K/uL)    Eosinophils Relative 3  0 - 5 (%)    Eosinophils Absolute 0.3  0.0 - 0.7 (K/uL)    Basophils Relative 0  0 - 1 (%)    Basophils Absolute 0.0  0.0 - 0.1 (K/uL)   POCT I-STAT, CHEM 8     Status: Normal   Collection Time   04/11/11  4:50 PM      Component Value Range Comment   Sodium 140  135 - 145 (mEq/L)    Potassium 4.3  3.5 - 5.1 (mEq/L)    Chloride 105  96 - 112 (mEq/L)    BUN 10  6 - 23 (mg/dL)    Creatinine, Ser 2.72  0.50 - 1.10 (mg/dL)    Glucose, Bld 90  70 - 99 (mg/dL)    Calcium, Ion 5.36  1.12 - 1.Alexandra (mmol/L)    TCO2 25  0 - 100 (mmol/L)    Hemoglobin 13.9  12.0 - 15.0 (g/dL)    HCT 64.4  03.4 - 74.2 (%)   HEPATIC FUNCTION PANEL     Status: Abnormal   Collection Time   04/11/11  7:54 PM      Component Value Range Comment   Total Protein 6.4  6.0 - 8.3 (g/dL)    Albumin 3.3 (*) 3.5 - 5.2 (g/dL)    AST 12  0 - 37 (U/L)    ALT 10  0 - 35 (U/L)    Alkaline Phosphatase 54  39 - 117 (U/L)    Total Bilirubin 0.3  0.3 - 1.2 (mg/dL)    Bilirubin, Direct <7.8  0.0 - 0.3 (mg/dL)    Indirect Bilirubin NOT CALCULATED  0.3 - 0.9 (mg/dL)   LIPASE, BLOOD     Status: Normal   Collection Time   04/11/11  7:54 PM      Component Value Range Comment   Lipase 23  11 - 59 (U/L)   LACTIC ACID, PLASMA     Status: Normal   Collection Time   04/11/11  7:54 PM      Component Value Range Comment   Lactic Acid, Venous 0.6  0.5 - 2.2 (mmol/L)    Dg Chest 2 View  04/11/2011  *RADIOLOGY REPORT*  Clinical Data: Cough,  smoker, bronchitis  CHEST - 2 VIEW  Comparison: 02/27/2011  Findings: Cardiomediastinal silhouette is stable.  No acute infiltrate or pulmonary edema.  Central mild bronchitic changes are noted.  Again noted nodular density projecting over the mid thoracic spine in the lateral view.  Again further evaluation with CT scan of the chest is recommended to exclude a lung nodule.  IMPRESSION:  No acute infiltrate or pulmonary edema.  Central mild bronchitic changes are noted.  Again noted nodular density projecting over the mid thoracic spine in the lateral view.  Again further evaluation with CT scan of the chest is recommended to exclude a lung nodule.  Original Report Authenticated By: Natasha Mead, M.D.   Dg Abd 2 Views  04/11/2011  *RADIOLOGY REPORT*  Clinical Data: Abdominal pain  ABDOMEN - 2 VIEW  Comparison: 03/26/2011  Findings: There is nonspecific nonobstructive bowel gas pattern. No free abdominal air is noted.  Bony structures are unremarkable.  IMPRESSION: Nonspecific nonobstructive bowel gas pattern.  No free abdominal air.  Original Report Authenticated By: Natasha Mead, M.D.    Review of Systems  Constitutional: Negative.   HENT: Negative.   Eyes: Negative.   Respiratory: Negative.   Cardiovascular: Negative.   Gastrointestinal: Positive for nausea, vomiting and abdominal pain.  Genitourinary: Negative.   Musculoskeletal: Negative.   Skin: Negative.   Neurological: Negative.   Endo/Heme/Allergies: Negative.   Psychiatric/Behavioral: Negative.     Blood pressure 160/89, pulse 91, temperature 97.9 F (36.6 C), temperature source Oral, resp. rate 16, SpO2 96.00%. Physical Exam  Constitutional: She is oriented to person, place, and time. She appears well-developed and well-nourished. No distress.  HENT:  Head: Normocephalic and atraumatic.  Right Ear: External ear normal.  Left Ear: External ear normal.  Nose: Nose normal.  Mouth/Throat: Oropharynx is clear and moist. No oropharyngeal  exudate.  Eyes: Conjunctivae are normal. Pupils are equal, round, and reactive to light. Right eye exhibits no discharge. Left eye exhibits no discharge. No scleral icterus.  Neck: Normal range of motion. Neck supple.  Cardiovascular: Normal rate, regular rhythm and normal heart sounds.   Respiratory: Effort normal and breath sounds normal. No respiratory distress. She has no wheezes. She has no rales.  GI: Soft. Bowel sounds are normal. She exhibits no distension. There is no tenderness. There is no rebound.  Musculoskeletal: Normal range of motion. She exhibits no edema and no tenderness.  Neurological: She is alert and oriented to person, place, and time.       Moves all extremities.  Skin: Skin is warm and dry. No rash noted. She is not diaphoretic. No erythema.  Psychiatric: Her behavior is normal.  Assessment/Plan #1. Abdominal pain - at this time we're not sure of the cause. Alexandra Fitzgerald's LFTs and abdominal x-ray are normal. Alexandra Fitzgerald is still complaining of significant pain. We will repeat a CAT scan and get a lipase levels. And for now we will place Alexandra Fitzgerald on clear liquid diet and pain relief medications. Gently hydrate. Further recommendation based on clinical close and CAT scan findings. Check urine for pregnancy. Check urine drug screen. #2. Abnormal chest x-ray with history of cigarette smoking - get CT chest to further study the abnormal chest x-ray. I have advised Alexandra Fitzgerald not to smoke cigarettes. #3. History of hypertension and bipolar disorder - continue present medications.  CODE STATUS - full code.  Kye Silverstein N. 04/11/2011, 9:15 PM

## 2011-04-11 NOTE — Progress Notes (Signed)
Gave pt oral contrast to drink one hour apart. Will continue to monitor tolerance.

## 2011-04-11 NOTE — ED Notes (Signed)
N/v/d andbad cold and abd pain x 2 days dizziness

## 2011-04-12 ENCOUNTER — Inpatient Hospital Stay (HOSPITAL_COMMUNITY): Payer: Self-pay

## 2011-04-12 DIAGNOSIS — R296 Repeated falls: Secondary | ICD-10-CM | POA: Diagnosis present

## 2011-04-12 DIAGNOSIS — J40 Bronchitis, not specified as acute or chronic: Secondary | ICD-10-CM | POA: Diagnosis present

## 2011-04-12 LAB — COMPREHENSIVE METABOLIC PANEL
Alkaline Phosphatase: 49 U/L (ref 39–117)
BUN: 7 mg/dL (ref 6–23)
Calcium: 8 mg/dL — ABNORMAL LOW (ref 8.4–10.5)
Creatinine, Ser: 0.57 mg/dL (ref 0.50–1.10)
GFR calc Af Amer: >90 mL/min (ref >90)
Glucose, Bld: 87 mg/dL (ref 70–99)
Total Protein: 5.6 g/dL — ABNORMAL LOW (ref 6.0–8.3)

## 2011-04-12 LAB — RAPID URINE DRUG SCREEN, HOSP PERFORMED
Amphetamines: NOT DETECTED
Benzodiazepines: NOT DETECTED
Cocaine: NOT DETECTED
Opiates: NOT DETECTED
Tetrahydrocannabinol: NOT DETECTED

## 2011-04-12 LAB — CBC
HCT: 34.2 % — ABNORMAL LOW (ref 36.0–46.0)
Hemoglobin: 11.3 g/dL — ABNORMAL LOW (ref 12.0–15.0)
RDW: 13 % (ref 11.5–15.5)
WBC: 6.2 10*3/uL (ref 4.0–10.5)

## 2011-04-12 MED ORDER — IOHEXOL 300 MG/ML  SOLN
40.0000 mL | Freq: Once | INTRAMUSCULAR | Status: AC | PRN
  Administered 2011-04-12: 40 mL via ORAL

## 2011-04-12 MED ORDER — LEVALBUTEROL TARTRATE 45 MCG/ACT IN AERO
1.0000 | INHALATION_SPRAY | Freq: Four times a day (QID) | RESPIRATORY_TRACT | Status: DC
  Administered 2011-04-12: 2 via RESPIRATORY_TRACT
  Filled 2011-04-12: qty 15

## 2011-04-12 MED ORDER — IOHEXOL 300 MG/ML  SOLN
100.0000 mL | Freq: Once | INTRAMUSCULAR | Status: AC | PRN
  Administered 2011-04-12: 100 mL via INTRAVENOUS

## 2011-04-12 MED ORDER — TRAMADOL HCL 50 MG PO TABS
50.0000 mg | ORAL_TABLET | Freq: Four times a day (QID) | ORAL | Status: DC | PRN
  Administered 2011-04-12 – 2011-04-13 (×5): 50 mg via ORAL
  Filled 2011-04-12 (×6): qty 1

## 2011-04-12 MED ORDER — LEVALBUTEROL TARTRATE 45 MCG/ACT IN AERO
2.0000 | INHALATION_SPRAY | Freq: Four times a day (QID) | RESPIRATORY_TRACT | Status: DC | PRN
Start: 2011-04-12 — End: 2011-04-13

## 2011-04-12 MED ORDER — GI COCKTAIL ~~LOC~~
30.0000 mL | Freq: Two times a day (BID) | ORAL | Status: DC | PRN
  Administered 2011-04-12: 30 mL via ORAL
  Filled 2011-04-12: qty 30

## 2011-04-12 MED ORDER — PANTOPRAZOLE SODIUM 40 MG PO TBEC
40.0000 mg | DELAYED_RELEASE_TABLET | Freq: Every day | ORAL | Status: DC
  Administered 2011-04-12 – 2011-04-13 (×2): 40 mg via ORAL
  Filled 2011-04-12 (×2): qty 1

## 2011-04-12 NOTE — Progress Notes (Signed)
Utilization review complete 

## 2011-04-12 NOTE — Progress Notes (Signed)
Patient ID: Alexandra Fitzgerald, female   DOB: [birth year], 51 y.o.   MRN: [id number] Subjective: Complains of aching abdominal pain in lower quadrants, slight worse after eating.  Will cough and then vomit.  Has had repeat CT scans at [facility] for same symptoms since 11/2010.  [place] [facility] [place]  Had an EGD at [facility] for similar symptoms in 2007.  She believes it was negative.  Also complains of cough and wheeze for the last several days.  CXR and CT Chest negative.  Does not use her albuterol at home because it makes her jittery.  Finally complains of falling approx 3x per week.  Becomes weak and dizzy then falls.  No LOC.  Objective: Weight change:   Intake/Output Summary (Last 24 hours) at 04/12/11 0715 Last data filed at 04/12/11 0100  Gross per 24 hour  Intake      0 ml  Output    250 ml  Net   -250 ml   Blood pressure 116/79, pulse 64, temperature 97.5 F (36.4 C), temperature source Oral, resp. rate 20, height 5\' 3"  (1.6 m), weight 74.2 kg (163 lb 9.3 oz), SpO2 97.00%. Filed Vitals:   04/11/11 2030 04/11/11 2134 04/12/11 0200 04/12/11 0513  BP: 160/89 151/93 146/79 116/79  Pulse:  71  64  Temp: 97.9 F (36.6 C) 98 F (36.7 C)  97.5 F (36.4 C)  TempSrc: Oral Oral  Oral  Resp:  18  20  Height:  5\' 3"  (1.6 m)    Weight:  74.2 kg (163 lb 9.3 oz)    SpO2: 96% 95%  97%    Physical Exam: General: No acute distress Lungs: Clear to auscultation bilaterally without wheezes or crackles Cardiovascular: Regular rate and rhythm without murmur gallop or rub normal S1 and S2 Abdomen: Nontender, nondistended, soft, bowel sounds positive, no rebound, no ascites, no appreciable mass Extremities: No significant cyanosis, clubbing, or edema bilateral lower extremities  Basic Metabolic Panel:  Lab 04/11/11 1027  NA 140  K 4.3  CL 105  CO2 --  GLUCOSE 90  BUN 10  CREATININE 0.70  CALCIUM --  MG --  PHOS --   Liver Function Tests:  Lab 04/11/11 1954  AST 12  ALT  10  ALKPHOS 54  BILITOT 0.3  PROT 6.4  ALBUMIN 3.3*    Lab 04/11/11 2124 04/11/11 1954  LIPASE 21 23  AMYLASE -- --   CBC:  Lab 04/11/11 1650 04/11/11 1626  WBC -- 8.1  NEUTROABS -- 5.3  HGB 13.9 13.0  HCT 41.0 38.7  MCV -- 92.4  PLT -- 267   Urine Drug Screen: Drugs of Abuse     Component Value Date/Time   LABOPIA NONE DETECTED 04/11/2011 2323   COCAINSCRNUR NONE DETECTED 04/11/2011 2323   LABBENZ NONE DETECTED 04/11/2011 2323   AMPHETMU NONE DETECTED 04/11/2011 2323   THCU NONE DETECTED 04/11/2011 2323   LABBARB POSITIVE* 04/11/2011 2323     Studies/Results: Scheduled Meds:   . cloNIDine  0.1 mg Oral TID  . fentaNYL  50 mcg Intravenous Once  . fentaNYL  50 mcg Intravenous Once  . FLUoxetine  40 mg Oral Daily  .  HYDROmorphone (DILAUDID) injection  1 mg Intravenous Once  . lamoTRIgine  25 mg Oral Daily  . ondansetron  4 mg Intravenous Once  . sodium chloride  500 mL Intravenous Once  . DISCONTD: ondansetron  4 mg Intravenous Once   Continuous Infusions:   . sodium  chloride 75 mL/hr at 04/11/11 2350   PRN Meds:.acetaminophen, acetaminophen, hydrALAZINE, HYDROmorphone, iohexol, iohexol, ondansetron (ZOFRAN) IV, ondansetron  Anti-infectives:  Anti-infectives    None      Assessment/Plan: Principal Problem:  *Abdominal pain Active Problems:  HTN (hypertension)  Bipolar 1 disorder  Bronchitis  Falling   #1. Abdominal pain - Repeat CT and labs are all WNL.  Patient had transvaginal ultrasound in January for 2013 that was normal.  Patient denies nsaids.  Will D/C dilaudid and start protonix as this may have a component of GERD.  This is likely functional abdominal pain.   Patient indicates she will return to the hospital if she goes home and develops pain again.  Will obtain records from [facility].  If she has not had an Abdominal U/S, or Hydascan will move forward with those to rule out biliary dyskinesia.  As patient has recently moved to this area we would  like to gather her records and establish a base line for her.  #2. Abnormal chest x-ray with history of cigarette smoking - CT Negative for lung nodule.  #3.  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  Will start xopenex inhaler and consider short dose of prednisone (would not start with out consideration of the patient's history of bipolar).    #3. History of hypertension and bipolar disorder - continue present medications.  #4.  Dispo:  Patient needs a primary care provider. D/C to home when ready.    LOS: 1 day   Stephani Police 04/12/2011, 7:15 AM [phone]

## 2011-04-12 NOTE — Progress Notes (Signed)
   CARE MANAGEMENT NOTE 04/12/2011  Patient:  Alexandra Fitzgerald, Alexandra Fitzgerald   Account Number:  [id number]  Date Initiated:  04/12/2011  Documentation initiated by:  Donn Pierini  Subjective/Objective Assessment:   Pt admitted with abd. pain     Action/Plan:   PTA pt lived at home with spouse, was independent with ADLs   Anticipated DC Date:  04/13/2011   Anticipated DC Plan:  HOME/SELF CARE  In-house referral  Clinical Social Worker      DC Planning Services  CM consult  GCCN / P4HM (established/new)      Choice offered to / List presented to:             Status of service:  In process, will continue to follow Medicare Important Message given?   (If response is "NO", the following Medicare IM given date fields will be blank) Date Medicare IM given:   Date Additional Medicare IM given:    Discharge Disposition:    Per UR Regulation:    If discussed at Long Length of Stay Meetings, dates discussed:    Comments:  04/12/11- 1120- Donn Pierini RN, BSN [phone] Spoke with pt and spouse Alexandra Fitzgerald) at bedside- per conversation pt states she uses [facility] for medications most are on the $4 list. She plans to establish with [facility] and has been seen by the [facility] for [facility] caseworker who is going to assist in getting pt set up with [facility]. [facility] counselor- Merry Proud has also made contact with pt. CM to continue to follow for d/c needs. Pt does request assistance with transportation at discharge- will let CSW know that bus passes will be needed at discharge.

## 2011-04-12 NOTE — Progress Notes (Signed)
I have personaly seen and examined Ms. Alexandra Fitzgerald. I agree with pe/ap as per M. York, PA note  She presents with recurrent abdominal pain and nausea and vomiting CT unremarkable.  She had w/u in the past at [facility] [place] Plan to get records, treat with ppi and may get HIDA for possible biliary dyskinesia Alexandra Fitzgerald

## 2011-04-13 ENCOUNTER — Inpatient Hospital Stay (HOSPITAL_COMMUNITY): Payer: Self-pay

## 2011-04-13 MED ORDER — LEVALBUTEROL TARTRATE 45 MCG/ACT IN AERO: 2.0000 | INHALATION_SPRAY | Freq: Four times a day (QID) | RESPIRATORY_TRACT | Status: DC | PRN

## 2011-04-13 MED ORDER — SINCALIDE 5 MCG IJ SOLR
0.0200 ug/kg | Freq: Once | INTRAMUSCULAR | Status: AC
  Administered 2011-04-13: 1.49 ug via INTRAVENOUS

## 2011-04-13 MED ORDER — TECHNETIUM TC 99M MEBROFENIN IV KIT
5.0000 | PACK | Freq: Once | INTRAVENOUS | Status: AC | PRN
  Administered 2011-04-13: 5 via INTRAVENOUS

## 2011-04-13 MED ORDER — ONDANSETRON HCL 4 MG PO TABS: 4.0000 mg | ORAL_TABLET | Freq: Four times a day (QID) | ORAL | Status: AC | PRN

## 2011-04-13 MED ORDER — TRAMADOL HCL 50 MG PO TABS: 50.0000 mg | ORAL_TABLET | Freq: Four times a day (QID) | ORAL | Status: AC | PRN

## 2011-04-13 NOTE — Progress Notes (Signed)
Clinical Social Worker received referral for transportation needs. Clinical Social Worker met with pt and pt significant other at bedside to discuss needs. Pt need is for transportation at home at discharge. Clinical Social Worker provided bus passes at bedside. RN providing teaching and discharge instructions at time of visit. Clinical Social Worker signing off.   Jacklynn Lewis, MSW, LCSWA  Clinical Social Work [phone]

## 2011-04-13 NOTE — Progress Notes (Signed)
Patient ID: Alexandra Fitzgerald, female   DOB: 21-Feb-1960, 51 y.o.   MRN: [id number]  Subjective: Complains of aching abdominal pain in lower quadrants, slight worse after eating.  Will cough and then vomit.  Has had repeat CT scans at [facility] for same symptoms since 11/2010.  Moved to [facility] [place].  Had an EGD at [facility] for similar symptoms in 2007.  She believes it was negative.  Also complains of cough and wheeze for the last several days.  CXR and CT Chest negative.  Does not use her albuterol at home because it makes her jittery.  Finally complains of falling approx 3x per week.  Becomes weak and dizzy then falls.  No LOC.  Objective: Weight change:   Intake/Output Summary (Last 24 hours) at 04/13/11 0745 Last data filed at 04/13/11 0521  Gross per 24 hour  Intake    960 ml  Output   2700 ml  Net  -1740 ml   Blood pressure 112/77, pulse 55, temperature 98.5 F (36.9 C), temperature source Oral, resp. rate 20, height 5\' 3"  (1.6 m), weight 74.2 kg (163 lb 9.3 oz), SpO2 98.00%. Filed Vitals:   04/12/11 1331 04/12/11 2226 04/12/11 2240 04/13/11 0524  BP: 111/68 115/70 118/74 112/77  Pulse: 63  63 55  Temp: 97.6 F (36.4 C)  98.7 F (37.1 C) 98.5 F (36.9 C)  TempSrc: Oral  Oral Oral  Resp: 20  22 20   Height:      Weight:      SpO2: 98%  98% 98%    Physical Exam: General: No acute distress Lungs: Clear to auscultation bilaterally without wheezes or crackles Cardiovascular: Regular rate and rhythm without murmur gallop or rub normal S1 and S2 Abdomen: Nontender, nondistended, soft, bowel sounds positive, no rebound, no ascites, no appreciable mass Extremities: No significant cyanosis, clubbing, or edema bilateral lower extremities  Basic Metabolic Panel:  Lab 04/12/11 4098 04/11/11 1650  NA 134* 140  K 3.5 4.3  CL 101 105  CO2 25 --  GLUCOSE 87 90  BUN 7 10  CREATININE 0.57 0.70  CALCIUM 8.0* --  MG -- --  PHOS -- --   Liver Function Tests:  Lab  04/12/11 0500 04/11/11 1954  AST 13 12  ALT 9 10  ALKPHOS 49 54  BILITOT 0.3 0.3  PROT 5.6* 6.4  ALBUMIN 3.0* 3.3*    Lab 04/11/11 2124 04/11/11 1954  LIPASE 21 23  AMYLASE -- --   CBC:  Lab 04/12/11 0500 04/11/11 1650 04/11/11 1626  WBC 6.2 -- 8.1  NEUTROABS -- -- 5.3  HGB 11.3* 13.9 --  HCT 34.2* 41.0 --  MCV 93.2 -- 92.4  PLT 205 -- 267   Urine Drug Screen: Drugs of Abuse     Component Value Date/Time   LABOPIA NONE DETECTED 04/11/2011 2323   COCAINSCRNUR NONE DETECTED 04/11/2011 2323   LABBENZ NONE DETECTED 04/11/2011 2323   AMPHETMU NONE DETECTED 04/11/2011 2323   THCU NONE DETECTED 04/11/2011 2323   LABBARB POSITIVE* 04/11/2011 2323     Studies/Results: Scheduled Meds:    . cloNIDine  0.1 mg Oral TID  . FLUoxetine  40 mg Oral Daily  . lamoTRIgine  25 mg Oral Daily  . pantoprazole  40 mg Oral Q1200  . DISCONTD: levalbuterol  1-2 puff Inhalation Q6H   Continuous Infusions:    . sodium chloride 50 mL/hr at 04/12/11 1305   PRN Meds:.acetaminophen, acetaminophen, gi cocktail, hydrALAZINE, levalbuterol, ondansetron (ZOFRAN)  IV, ondansetron, traMADol, DISCONTD: HYDROmorphone  Anti-infectives:  Anti-infectives    None      Assessment/Plan: Principal Problem:  *Abdominal pain Active Problems:  HTN (hypertension)  Bipolar 1 disorder  Bronchitis  Falling   #1. Abdominal pain - Repeat CT and labs are all WNL.  Patient had transvaginal ultrasound in January for 2013 that was normal.  Patient denies nsaids.  Will D/C dilaudid and start protonix as this may have a component of GERD.  This is likely functional abdominal pain.   Patient indicates she will return to the hospital if she goes home and develops pain again.  Will not start Reglan because of patient's co-morbidity, Bipolar.  Will obtain records from [facility].  If she has not had an Abdominal U/S, or Hydascan will move forward with those to rule out biliary dyskinesia.  As patient has recently moved to  this area we would like to gather her records and establish a baseline for her.  Hydascan pending 04/13/11.  #2. Abnormal chest x-ray with history of cigarette smoking - CT Negative for lung nodule.  #3.  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  Will start xopenex inhaler and consider short dose of prednisone (would not start with out consideration of the patient's history of bipolar).    #3. History of hypertension and bipolar disorder - continue present medications.  #4.  Dispo:  Patient needs a primary care provider. D/C to home when ready.    LOS: 2 days   Stephani Police 04/13/2011, 7:45 AM [phone]

## 2011-04-13 NOTE — Progress Notes (Signed)
   CARE MANAGEMENT NOTE 04/13/2011  Patient:  Alexandra Fitzgerald, Alexandra Fitzgerald   Account Number:  [id number]  Date Initiated:  04/12/2011  Documentation initiated by:  Donn Pierini  Subjective/Objective Assessment:   Pt admitted with abd. pain     Action/Plan:   PTA pt lived at home with spouse, was independent with ADLs   Anticipated DC Date:  04/13/2011   Anticipated DC Plan:  HOME/SELF CARE  In-house referral  Clinical Social Worker      DC Planning Services  CM consult  GCCN / P4HM (established/new)      Choice offered to / List presented to:             Status of service:  Completed, signed off Medicare Important Message given?   (If response is "NO", the following Medicare IM given date fields will be blank) Date Medicare IM given:   Date Additional Medicare IM given:    Discharge Disposition:  HOME/SELF CARE  Per UR Regulation:    If discussed at Long Length of Stay Meetings, dates discussed:    Comments:  04/13/11- 1430- Donn Pierini RN, BSN [phone] Spoke with Stephan Minister with [facility] to confirm referral- per conversation pt has not been set up with [facility] and will need a referral to the [employer] [facility] case Production designer, theatre/television/film. Refferal called to Johney Frame and info faxed. Informed pt's husband about the misinformation given regarding [facility]- explained that [facility] is not currently taking new pts- and that pt would have to pt set up with another practice. Johney Frame with [facility] is to follow up with pt. Per pharmacy pt is not eligible for any medication assistance at this time.  04/12/11- 1120- Donn Pierini RN, BSN [phone] Spoke with pt and spouse Sherilyn Cooter) at bedside- per conversation pt states she uses [facility] for medications most are on the $4 list. She plans to establish with [facility] and has been seen by the [facility] for [facility] caseworker who is going to assist in getting pt set up with  [facility]. [facility] counselor- Merry Proud has also made contact with pt. CM to continue to follow for d/c needs. Pt does request assistance with transportation at discharge- will let CSW know that bus passes will be needed at discharge.

## 2011-04-13 NOTE — Progress Notes (Signed)
I have seen and examined the patient, agree with assessment and plan as outlined by Ms York, Abdomen is soft, will likely d/c home later today if HIDA Scan results are negative

## 2011-04-13 NOTE — Discharge Summary (Signed)
Patient seen and examined, HIDA scan negative, will discharge home today.

## 2011-04-13 NOTE — Progress Notes (Signed)
Patient discharge teaching given, including activity, diet, follow-up appoints, and medications. Patient verbalized understanding of all discharge instructions. IV access was d/c'd. Vitals are stable. Skin is intact except as charted in most recent assessments. Pt to be escorted out by NT, to be driven home by family. 

## 2011-04-13 NOTE — Discharge Summary (Signed)
Patient ID: Alexandra Fitzgerald MRN: [id number] DOB/AGE: 1960-05-12 51 y.o.  Admit date: 04/11/2011 Discharge date: 04/13/2011  Primary Care Physician:  No Pcp Per Pt  Discharge Diagnoses:    Present on Admission:  .Abdominal pain .Bipolar 1 disorder .HTN (hypertension) .Bronchitis .Falling   Medication List  As of 04/13/2011  2:00 PM   TAKE these medications         cloNIDine 0.1 MG tablet   Commonly known as: CATAPRES   Take 0.1 mg by mouth 3 (three) times daily.      FLUoxetine 20 MG capsule   Commonly known as: PROZAC   Take 40 mg by mouth daily.      lamoTRIgine 25 MG tablet   Commonly known as: LAMICTAL   Take 25 mg by mouth daily.      levalbuterol 45 MCG/ACT inhaler   Commonly known as: XOPENEX HFA   Inhale 2 puffs into the lungs every 6 (six) hours as needed for wheezing or shortness of breath.      ondansetron 4 MG tablet   Commonly known as: ZOFRAN   Take 1 tablet (4 mg total) by mouth every 6 (six) hours as needed for nausea.      traMADol 50 MG tablet   Commonly known as: ULTRAM   Take 1 tablet (50 mg total) by mouth every 6 (six) hours as needed.            Brief H and P: From the admission note:  51 year-old female with history of hypertension bipolar disorder has increasing abdominal pain over the last month which has been recurrent. Patient did come to the ER 3 weeks ago and has had a CAT scan of the abdomen and pelvis which was negative for anything acute. Last 3 days patient started having the pain again which is usually in the lower quadrants and it's been persistent with nausea vomiting. Denies any diarrhea. Denies any blood in the vomitus. Despite giving pain relief medications patient still is complaining of pain. Patient will be admitted for further observation.  1.  Abdominal Pain - the patient reported a pain in her lower quadrants which was slightly worse after eating. She states she'll cough and then vomit. She denies NSAID use melena,  hematemesis and hematochezia. She has had repeat CT scan at that time for the same symptoms since October 2012. Prior to that she was taken care of in [place]. She reports that she had an upper endoscopy at [facility] in 2007 for the same symptoms and she believes it was negative.  In January 2013 the patient had a transvaginal ultrasound for lower abdominal pain and possible ovarian cyst. The transvaginal ultrasound was found to be within normal limits.  The patient's LFTs and lipase are within normal limits. Her bmet and CBC are essentially within normal limits other than mild anemia.  In an attempt to rule out biliary dyskinesia the patient was sent today for HIDA scan if it is within normal limits she will be discharged home.    It is felt that this patient may have functional abdominal pain as her workup appears to be negative. We will schedule her for outpatient followup with gastroenterology.  2.  Hypertension - the patient's blood pressures have been well controlled on her home dosage of clonidine. No adjustments were made during this hospitalization.    3.  Bipolar 1 disorder - the patient's bipolar disorder remained stable there were no episodes during this hospitalization she will be continued on  her Lamictal and Prozac.  4.  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  As evidenced by wheezing and mild bronchitic changes on her x-ray.  Her white count is within normal limits she is afebrile she does not appear to need antibiotics.  She tells me she will not use her albuterol inhaler at home because it makes her shaky. She was started on Xopenex inhaler as an inpatient and will be given a prescription for Xopenex inhaler outpatient.  5.  Primary Care Physician -  the patient does not have a primary care physician at this time as she has recently moved to [facility] from [facility]. She has been offered resources through [employer] for [employer].   She can choose to take advantage of those resources or she can choose to make an appointment with the [facility] to establish a primary care physician.  The patient and her husband acknowledge the importance of establishing a relationship with a primary care physician.  Physical Exam on Discharge: General: Alert, awake, oriented x3, in no acute distress. HEENT: No bruits, no goiter. Heart: Regular rate and rhythm, without murmurs, rubs, gallops. Lungs: Clear to auscultation bilaterally. Abdomen: Soft, nontender, nondistended, positive bowel sounds. Extremities: No clubbing cyanosis or edema with positive pedal pulses. Neuro: Grossly intact, nonfocal.  Filed Vitals:   04/12/11 1331 04/12/11 2226 04/12/11 2240 04/13/11 0524  BP: 111/68 115/70 118/74 112/77  Pulse: 63  63 55  Temp: 97.6 F (36.4 C)  98.7 F (37.1 C) 98.5 F (36.9 C)  TempSrc: Oral  Oral Oral  Resp: 20  22 20   Height:      Weight:      SpO2: 98%  98% 98%     Intake/Output Summary (Last 24 hours) at 04/13/11 1400 Last data filed at 04/13/11 1100  Gross per 24 hour  Intake      0 ml  Output   1600 ml  Net  -1600 ml    Basic Metabolic Panel:  Lab 04/12/11 1610 04/11/11 1650  NA 134* 140  K 3.5 4.3  CL 101 105  CO2 25 --  GLUCOSE 87 90  BUN 7 10  CREATININE 0.57 0.70  CALCIUM 8.0* --  MG -- --  PHOS -- --   Liver Function Tests:  Lab 04/12/11 0500 04/11/11 1954  AST 13 12  ALT 9 10  ALKPHOS 49 54  BILITOT 0.3 0.3  PROT 5.6* 6.4  ALBUMIN 3.0* 3.3*    Lab 04/11/11 2124 04/11/11 1954  LIPASE 21 23  AMYLASE -- --   No results found for this basename: AMMONIA:2 in the last 168 hours CBC:  Lab 04/12/11 0500 04/11/11 1650 04/11/11 1626  WBC 6.2 -- 8.1  NEUTROABS -- -- 5.3  HGB 11.3* 13.9 --  HCT 34.2* 41.0 --  MCV 93.2 -- 92.4  PLT 205 -- 267   Urine Drug Screen: Drugs of Abuse     Component Value Date/Time   LABOPIA NONE DETECTED 04/11/2011 2323   COCAINSCRNUR NONE DETECTED  04/11/2011 2323   LABBENZ NONE DETECTED 04/11/2011 2323   AMPHETMU NONE DETECTED 04/11/2011 2323   THCU NONE DETECTED 04/11/2011 2323   LABBARB POSITIVE* 04/11/2011 2323     Significant Diagnostic Studies:  Dg Chest 2 View  04/11/2011  *RADIOLOGY REPORT*  Clinical Data: Cough, smoker, bronchitis  CHEST - 2 VIEW  Comparison: 02/27/2011  Findings: Cardiomediastinal silhouette is stable.  No acute infiltrate or pulmonary  edema.  Central mild bronchitic changes are noted.  Again noted nodular density projecting over the mid thoracic spine in the lateral view.  Again further evaluation with CT scan of the chest is recommended to exclude a lung nodule.  IMPRESSION:  No acute infiltrate or pulmonary edema.  Central mild bronchitic changes are noted.  Again noted nodular density projecting over the mid thoracic spine in the lateral view.  Again further evaluation with CT scan of the chest is recommended to exclude a lung nodule.  Original Report Authenticated By: Natasha Mead, M.D.   Ct Chest, Abdomen Pelvis W Contrast  04/12/2011  *RADIOLOGY REPORT*  Clinical Data:  Chest pain.  Abdominal pain.  CT CHEST, ABDOMEN AND PELVIS WITH CONTRAST  Technique:  Multidetector CT imaging of the chest, abdomen and pelvis was performed following the standard protocol during bolus administration of intravenous contrast.  Contrast: OMNIPAQUE IOHEXOL 300 MG/ML IJ SOLN,71mL OMNIPAQUE IOHEXOL 300 MG/ML IJ SOLN  Comparison:  03/26/2011  CT CHEST  Findings:  No abnormal mediastinal adenopathy.  No pericardial effusion.  No pneumothorax and no pleural effusion.  Minimal dependent atelectasis in the lungs.  No evidence of pulmonary nodule.  Prominent osteophytes are present in the left paraspinal location on image 33 likely accounting for the radiographic abnormality on the lateral view.  No destructive bone lesion.  IMPRESSION: No evidence of pulmonary nodule.  Prominent osteophytes account for the radiographic abnormality.  CT ABDOMEN  AND PELVIS  Findings:  Liver, gallbladder, spleen, gallbladder are within normal limits.  Stable adrenal glands.  No free fluid.  Normal appendix.  Cystic changes in the ovaries.  Uterus and bladder are within normal limits.  Trace free fluid in the pelvis.  Tiny calculi in the collecting system of the left kidney.  IMPRESSION: Left nephrolithiasis.  No acute findings in the abdomen or pelvis.  Original Report Authenticated By: Donavan Burnet, M.D.    Ct Abdomen Pelvis W Contrast  03/26/2011  *RADIOLOGY REPORT*  Clinical Data: Abdominal pain.  CT ABDOMEN AND PELVIS WITH CONTRAST  Technique:  Multidetector CT imaging of the abdomen and pelvis was performed following the standard protocol during bolus administration of intravenous contrast.  Contrast: OMNIPAQUE IOHEXOL 300 MG/ML IV SOLN  Comparison: 11/14/2010  Findings: Lung bases are clear.  No effusions.  Heart is normal size.  Liver, gallbladder, spleen, pancreas, stomach, adrenals and right kidney are unremarkable.  Tiny nonobstructing stone in the mid pole of the left kidney.  No ureteral stones or hydronephrosis.  Urinary bladder, uterus and adnexa are unremarkable.  Appendix is visualized and is normal. Bowel grossly unremarkable. No free fluid, free air, or adenopathy.  No acute bony abnormality.  IMPRESSION: Normal study.  Original Report Authenticated By: Cyndie Chime, M.D.   Dg Abd 2 Views  04/11/2011  *RADIOLOGY REPORT*  Clinical Data: Abdominal pain  ABDOMEN - 2 VIEW  Comparison: 03/26/2011  Findings: There is nonspecific nonobstructive bowel gas pattern. No free abdominal air is noted.  Bony structures are unremarkable.  IMPRESSION: Nonspecific nonobstructive bowel gas pattern.  No free abdominal air.       Disposition and Follow-up: Still complaining of abdominal pain, but stable for discharge to home.  Discharge Orders    Future Appointments: Provider: Department: Dept Phone: Center:   05/09/2011 3:00 PM Rachael Fee, MD  [facility] Office [phone] [facility]     Future Orders Please Complete By Expires   Diet -  advance diet slowly from liquids to  low fiber solids.          Increase activity slowly             Follow-up Information    Follow up with Rob Bunting, MD on 05/09/2011. (at 3:00)    Contact information:   [address] [place] [address] [phone]           Time spent on Discharge:  40 min.  SignedStephani Police 04/13/2011, 2:00 PM [phone]

## 2011-04-25 ENCOUNTER — Encounter (HOSPITAL_COMMUNITY): Payer: Self-pay | Admitting: *Deleted

## 2011-04-25 ENCOUNTER — Emergency Department (HOSPITAL_COMMUNITY): Payer: Self-pay

## 2011-04-25 ENCOUNTER — Emergency Department (HOSPITAL_COMMUNITY)
Admission: EM | Admit: 2011-04-25 | Discharge: 2011-04-25 | Disposition: A | Discharge status: Other Acute Inpatient Hospital | Payer: Self-pay | Attending: Emergency Medicine | Admitting: Emergency Medicine

## 2011-04-25 DIAGNOSIS — R45851 Suicidal ideations: Secondary | ICD-10-CM | POA: Insufficient documentation

## 2011-04-25 DIAGNOSIS — S60219A Contusion of unspecified wrist, initial encounter: Secondary | ICD-10-CM | POA: Insufficient documentation

## 2011-04-25 DIAGNOSIS — F329 Major depressive disorder, single episode, unspecified: Secondary | ICD-10-CM

## 2011-04-25 DIAGNOSIS — R109 Unspecified abdominal pain: Secondary | ICD-10-CM | POA: Insufficient documentation

## 2011-04-25 DIAGNOSIS — G8929 Other chronic pain: Secondary | ICD-10-CM | POA: Insufficient documentation

## 2011-04-25 DIAGNOSIS — F319 Bipolar disorder, unspecified: Secondary | ICD-10-CM | POA: Insufficient documentation

## 2011-04-25 DIAGNOSIS — I10 Essential (primary) hypertension: Secondary | ICD-10-CM | POA: Insufficient documentation

## 2011-04-25 DIAGNOSIS — X58XXXA Exposure to other specified factors, initial encounter: Secondary | ICD-10-CM | POA: Insufficient documentation

## 2011-04-25 DIAGNOSIS — R197 Diarrhea, unspecified: Secondary | ICD-10-CM | POA: Insufficient documentation

## 2011-04-25 LAB — COMPREHENSIVE METABOLIC PANEL
Albumin: 4.3 g/dL (ref 3.5–5.2)
Alkaline Phosphatase: 66 U/L (ref 39–117)
BUN: 10 mg/dL (ref 6–23)
Chloride: 102 mEq/L (ref 96–112)
Creatinine, Ser: 0.57 mg/dL (ref 0.50–1.10)
GFR calc Af Amer: >90 mL/min (ref >90)
GFR calc non Af Amer: >90 mL/min (ref >90)
Glucose, Bld: 102 mg/dL — ABNORMAL HIGH (ref 70–99)
Potassium: 3.7 mEq/L (ref 3.5–5.1)
Total Bilirubin: 0.3 mg/dL (ref 0.3–1.2)

## 2011-04-25 LAB — CBC
HCT: 38.8 % (ref 36.0–46.0)
Hemoglobin: 13.3 g/dL (ref 12.0–15.0)
MCV: 92.4 fL (ref 78.0–100.0)
RDW: 13 % (ref 11.5–15.5)
WBC: 6.8 10*3/uL (ref 4.0–10.5)

## 2011-04-25 LAB — RAPID URINE DRUG SCREEN, HOSP PERFORMED
Amphetamines: NOT DETECTED
Opiates: NOT DETECTED

## 2011-04-25 LAB — ETHANOL: Alcohol, Ethyl (B): <11 mg/dL (ref 0–11)

## 2011-04-25 MED ORDER — LAMOTRIGINE 25 MG PO TABS
25.0000 mg | ORAL_TABLET | Freq: Every day | ORAL | Status: DC
  Administered 2011-04-25: 25 mg via ORAL
  Filled 2011-04-25 (×2): qty 1

## 2011-04-25 MED ORDER — LORAZEPAM 1 MG PO TABS: 1.0000 mg | ORAL_TABLET | Freq: Three times a day (TID) | ORAL | Status: DC | PRN

## 2011-04-25 MED ORDER — NICOTINE 21 MG/24HR TD PT24
21.0000 mg | MEDICATED_PATCH | Freq: Every day | TRANSDERMAL | Status: DC
  Filled 2011-04-25: qty 1

## 2011-04-25 MED ORDER — OXYCODONE-ACETAMINOPHEN 5-325 MG PO TABS
ORAL_TABLET | ORAL | Status: AC
  Administered 2011-04-25: 1
  Filled 2011-04-25: qty 1

## 2011-04-25 MED ORDER — ONDANSETRON HCL 4 MG PO TABS: 4.0000 mg | ORAL_TABLET | Freq: Three times a day (TID) | ORAL | Status: DC | PRN

## 2011-04-25 MED ORDER — LEVALBUTEROL TARTRATE 45 MCG/ACT IN AERO
2.0000 | INHALATION_SPRAY | Freq: Four times a day (QID) | RESPIRATORY_TRACT | Status: DC | PRN
  Filled 2011-04-25: qty 15

## 2011-04-25 MED ORDER — ONDANSETRON HCL 4 MG PO TABS
8.0000 mg | ORAL_TABLET | Freq: Once | ORAL | Status: AC
  Administered 2011-04-25: 8 mg via ORAL

## 2011-04-25 MED ORDER — FLUOXETINE HCL 20 MG PO CAPS
40.0000 mg | ORAL_CAPSULE | Freq: Every day | ORAL | Status: DC
  Administered 2011-04-25: 40 mg via ORAL
  Filled 2011-04-25 (×2): qty 2

## 2011-04-25 MED ORDER — IBUPROFEN 200 MG PO TABS: 600.0000 mg | ORAL_TABLET | Freq: Three times a day (TID) | ORAL | Status: DC | PRN

## 2011-04-25 MED ORDER — ACETAMINOPHEN 325 MG PO TABS: 650.0000 mg | ORAL_TABLET | ORAL | Status: DC | PRN

## 2011-04-25 MED ORDER — CLONIDINE HCL 0.1 MG PO TABS
0.1000 mg | ORAL_TABLET | Freq: Three times a day (TID) | ORAL | Status: DC
  Administered 2011-04-25: 0.1 mg via ORAL
  Filled 2011-04-25: qty 1

## 2011-04-25 MED ORDER — ALUM & MAG HYDROXIDE-SIMETH 200-200-20 MG/5ML PO SUSP: 30.0000 mL | ORAL | Status: DC | PRN

## 2011-04-25 MED ORDER — ZOLPIDEM TARTRATE 5 MG PO TABS: 5.0000 mg | ORAL_TABLET | Freq: Every evening | ORAL | Status: DC | PRN

## 2011-04-25 MED ORDER — OXYCODONE-ACETAMINOPHEN 5-325 MG PO TABS: 1.0000 | ORAL_TABLET | Freq: Once | ORAL | Status: DC

## 2011-04-25 NOTE — ED Provider Notes (Addendum)
40 p.m. called to bedside as patient had vomited one time and complained of abdominal pain. Patient has had similar abdominal pain intermittently for the past 1-2 months this morning. She reports she had 3 episodes of watery diarrhea. Pain is presently severe sharp similar pain she gets daily which is helped with Percocet. On exam she is in no distress lungs clear to auscultation heart regular rate and rhythm abdomen is obese normal active bowel sounds nontender. Percocet ordered.  Doug Sou, MD 04/25/11 1650  8:25 PM patient alert ambulatory Glasgow Coma Score 15 pain is improved Stable for transfer to psychiatric facility Plan patient transferred to [facility] Livia Snellen accepting MD  Doug Sou, MD 04/25/11 2031

## 2011-04-25 NOTE — ED Notes (Signed)
Pt. Vomited upon arrival to TCU.  ED MD notified.

## 2011-04-25 NOTE — ED Notes (Signed)
Patient transported to X-ray 

## 2011-04-25 NOTE — ED Provider Notes (Signed)
History     CSN: [id number]  Arrival date & time 04/25/11  1313   First MD Initiated Contact with Patient 04/25/11 1413      Chief Complaint  Patient presents with  . Medical Clearance  . Suicidal    (Consider location/radiation/quality/duration/timing/severity/associated sxs/prior treatment) HPI Comments: Not taking medications  Patient is a 51 y.o. female presenting with mental health disorder. The history is provided by the patient. No language interpreter was used.  Mental Health Problem The primary symptoms do not include hallucinations. The current episode started this week (increased depression and SI). This is a recurrent problem.  The onset of the illness is precipitated by a stressful event and emotional stress. The degree of incapacity that she is experiencing as a consequence of her illness is moderate. Sequelae of the illness include harmed interpersonal relations and an inability to care for self. Additional symptoms of the illness do not include no appetite change, no fatigue, no flight of ideas, no inflated self-esteem, no poor judgment, no headaches or no abdominal pain. She admits to suicidal ideas. She does have a plan to commit suicide. She contemplates harming herself. She has not already injured self. She does not contemplate injuring another person. She has not already  injured another person. Risk factors that are present for mental illness include a history of mental illness.    Past Medical History  Diagnosis Date  . Hypertension   . Bipolar 1 disorder   . Arthritis   . Migraine headache   . Acid reflux     History reviewed. No pertinent past surgical history.  Family History  Problem Relation Age of Onset  . Tuberculosis Mother   . Stroke Mother   . Tuberculosis Father   . Heart disease Father   . Bipolar disorder Sister   . Drug abuse Sister     History  Substance Use Topics  . Smoking status: Current Everyday Smoker  . Smokeless tobacco:  Never Used  . Alcohol Use: Yes     occ    OB History    Grav Para Term Preterm Abortions TAB SAB Ect Mult Living                  Review of Systems  Constitutional: Negative for fever, activity change, appetite change and fatigue.  HENT: Negative for congestion, sore throat, rhinorrhea, neck pain and neck stiffness.   Respiratory: Negative for cough and shortness of breath.   Cardiovascular: Negative for chest pain and palpitations.  Gastrointestinal: Negative for nausea, vomiting and abdominal pain.  Genitourinary: Negative for dysuria, urgency, frequency and flank pain.  Musculoskeletal: Positive for arthralgias. Negative for myalgias and back pain.  Neurological: Negative for dizziness, weakness, light-headedness, numbness and headaches.  Psychiatric/Behavioral: Positive for suicidal ideas. Negative for hallucinations and self-injury. The patient is not nervous/anxious.   All other systems reviewed and are negative.    Allergies  Doxycycline; Flagyl; Penicillins; Shellfish allergy; and Sulfa antibiotics  Home Medications   Current Outpatient Rx  Name Route Sig Dispense Refill  . CLONIDINE HCL 0.1 MG PO TABS Oral Take 0.1 mg by mouth 3 (three) times daily.     Marland Kitchen FLUOXETINE HCL 20 MG PO CAPS Oral Take 40 mg by mouth daily.     Marland Kitchen LAMOTRIGINE 25 MG PO TABS Oral Take 25 mg by mouth daily.    Marland Kitchen LEVALBUTEROL TARTRATE 45 MCG/ACT IN AERO Inhalation Inhale 2 puffs into the lungs every 6 (six) hours as needed for wheezing  or shortness of breath. 1 Inhaler 1    BP 128/82  Pulse 99  Temp(Src) 97.1 F (36.2 C) (Oral)  Resp 18  SpO2 98%  Physical Exam  Nursing note and vitals reviewed. Constitutional: She is oriented to person, place, and time. She appears well-developed and well-nourished. No distress.       tearful  HENT:  Head: Normocephalic and atraumatic.  Mouth/Throat: Oropharynx is clear and moist.  Eyes: Conjunctivae and EOM are normal. Pupils are equal, round, and  reactive to light.  Neck: Normal range of motion. Neck supple.  Cardiovascular: Normal rate, regular rhythm, normal heart sounds and intact distal pulses.  Exam reveals no gallop.   No murmur heard. Pulmonary/Chest: Effort normal and breath sounds normal. No respiratory distress.  Abdominal: Soft. Bowel sounds are normal. There is no tenderness.  Musculoskeletal: Normal range of motion. She exhibits no edema.       Patient with some tenderness to the right wrist and hand secondary to contusion.  Neurological: She is alert and oriented to person, place, and time. No cranial nerve deficit.  Skin: Skin is warm and dry.  Psychiatric: She is withdrawn. She is not actively hallucinating. She expresses inappropriate judgment. She exhibits a depressed mood. She expresses suicidal ideation. She expresses no homicidal ideation. She expresses suicidal plans (slit wrist). She expresses no homicidal plans.    ED Course  Procedures (including critical care time)  Labs Reviewed  COMPREHENSIVE METABOLIC PANEL - Abnormal; Notable for the following:    Glucose, Bld 102 (*)    All other components within normal limits  URINE RAPID DRUG SCREEN (HOSP PERFORMED) - Abnormal; Notable for the following:    Barbiturates POSITIVE (*)    All other components within normal limits  CBC  ETHANOL   Dg Hand Complete Right  04/25/2011  *RADIOLOGY REPORT*  Clinical Data: Hand injury.  Pain.  RIGHT HAND - COMPLETE 3+ VIEW  Comparison: None.  Findings: There is no evidence for an acute fracture. No subluxation or dislocation.  Joint spaces are preserved.  No worrisome lytic or sclerotic osseous abnormality.  IMPRESSION: No acute bony findings.  Original Report Authenticated By: ERIC A. MANSELL, M.D.     1. Depression   2. Suicidal ideation   3. Wrist contusion       MDM  Medically clear for psychiatric evaluation. Workup was relatively unremarkable except for the presence of barbiturates in the urine. Patient with  active suicidal ideation. She is being evaluated by the mobile crisis unit. Will require admission for further evaluation and psychiatric stabilization. Psychiatric holding Orders were performed.  Care to be transferred to my colleague Dr Ethelda Chick who will await final recs for disposition.        Dayton Bailiff, MD 04/25/11 [phone]
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
## 2011-05-04 ENCOUNTER — Emergency Department (HOSPITAL_COMMUNITY)
Admission: EM | Admit: 2011-05-04 | Discharge: 2011-05-04 | Disposition: A | Discharge status: Home / Self Care | Payer: Self-pay | Attending: Emergency Medicine | Admitting: Emergency Medicine

## 2011-05-04 ENCOUNTER — Encounter (HOSPITAL_COMMUNITY): Payer: Self-pay | Admitting: *Deleted

## 2011-05-04 DIAGNOSIS — R109 Unspecified abdominal pain: Secondary | ICD-10-CM | POA: Insufficient documentation

## 2011-05-04 DIAGNOSIS — I10 Essential (primary) hypertension: Secondary | ICD-10-CM | POA: Insufficient documentation

## 2011-05-04 DIAGNOSIS — R197 Diarrhea, unspecified: Secondary | ICD-10-CM | POA: Insufficient documentation

## 2011-05-04 DIAGNOSIS — R112 Nausea with vomiting, unspecified: Secondary | ICD-10-CM | POA: Insufficient documentation

## 2011-05-04 LAB — CBC
Hemoglobin: 12.7 g/dL (ref 12.0–15.0)
MCHC: 33 g/dL (ref 30.0–36.0)

## 2011-05-04 LAB — COMPREHENSIVE METABOLIC PANEL
AST: 21 U/L (ref 0–37)
Albumin: 4.1 g/dL (ref 3.5–5.2)
Alkaline Phosphatase: 59 U/L (ref 39–117)
BUN: 13 mg/dL (ref 6–23)
Chloride: 100 mEq/L (ref 96–112)
Potassium: 4 mEq/L (ref 3.5–5.1)
Total Bilirubin: 0.3 mg/dL (ref 0.3–1.2)

## 2011-05-04 LAB — URINALYSIS, ROUTINE W REFLEX MICROSCOPIC
Bilirubin Urine: NEGATIVE
Glucose, UA: NEGATIVE mg/dL
Hgb urine dipstick: NEGATIVE
Ketones, ur: NEGATIVE mg/dL
Protein, ur: 30 mg/dL — AB

## 2011-05-04 LAB — DIFFERENTIAL
Basophils Absolute: 0 10*3/uL (ref 0.0–0.1)
Basophils Relative: 0 % (ref 0–1)
Monocytes Relative: 7 % (ref 3–12)
Neutro Abs: 4.7 10*3/uL (ref 1.7–7.7)
Neutrophils Relative %: 73 % (ref 43–77)

## 2011-05-04 LAB — URINE MICROSCOPIC-ADD ON

## 2011-05-04 MED ORDER — MORPHINE SULFATE 4 MG/ML IJ SOLN
4.0000 mg | Freq: Once | INTRAMUSCULAR | Status: AC
  Administered 2011-05-04: 4 mg via INTRAVENOUS
  Filled 2011-05-04: qty 1

## 2011-05-04 MED ORDER — SODIUM CHLORIDE 0.9 % IV SOLN
1000.0000 mL | Freq: Once | INTRAVENOUS | Status: AC
  Administered 2011-05-04: 1000 mL via INTRAVENOUS

## 2011-05-04 MED ORDER — DICYCLOMINE HCL 20 MG PO TABS: 20.0000 mg | ORAL_TABLET | Freq: Two times a day (BID) | ORAL | Status: DC

## 2011-05-04 MED ORDER — ONDANSETRON HCL 4 MG/2ML IJ SOLN
4.0000 mg | Freq: Once | INTRAMUSCULAR | Status: AC
  Administered 2011-05-04: 4 mg via INTRAVENOUS
  Filled 2011-05-04: qty 2

## 2011-05-04 MED ORDER — ONDANSETRON 8 MG PO TBDP: 8.0000 mg | ORAL_TABLET | Freq: Three times a day (TID) | ORAL | Status: DC | PRN

## 2011-05-04 MED ORDER — SODIUM CHLORIDE 0.9 % IV SOLN
1000.0000 mL | INTRAVENOUS | Status: DC
  Administered 2011-05-04 (×2): 1000 mL via INTRAVENOUS

## 2011-05-04 NOTE — ED Provider Notes (Signed)
History     CSN: [id number]  Arrival date & time 05/04/11  1215   First MD Initiated Contact with Patient 05/04/11 1506      Chief Complaint  Patient presents with  . Abdominal Pain  . Nausea    Patient is a 51 y.o. female presenting with abdominal pain. The history is provided by the patient.  Abdominal Pain The primary symptoms of the illness include abdominal pain, vomiting and diarrhea. The current episode started yesterday. The onset of the illness was gradual.  The abdominal pain is generalized. The abdominal pain is relieved by nothing. The abdominal pain is exacerbated by certain positions.  Vomiting occurs 6 to 10 times per day. The emesis contains bright red blood (she noticed it one time.  The last time she vomited.).  Diarrhea characteristics: non bloody. The diarrhea occurs 2 to 4 times per day.  Additional symptoms associated with the illness include anorexia. Symptoms associated with the illness do not include chills, urgency or frequency. Associated medical issues comments: Pt has recurrent episodes of abdominal pain.  She is not sure what causes it..    Past Medical History  Diagnosis Date  . Hypertension   . Bipolar 1 disorder   . Arthritis   . Migraine headache   . Acid reflux     History reviewed. No pertinent past surgical history.  Family History  Problem Relation Age of Onset  . Tuberculosis Mother   . Stroke Mother   . Tuberculosis Father   . Heart disease Father   . Bipolar disorder Sister   . Drug abuse Sister     History  Substance Use Topics  . Smoking status: Current Everyday Smoker  . Smokeless tobacco: Never Used  . Alcohol Use: Yes     occ    OB History    Grav Para Term Preterm Abortions TAB SAB Ect Mult Living                  Review of Systems  Constitutional: Negative for chills.  Gastrointestinal: Positive for vomiting, abdominal pain, diarrhea and anorexia.  Genitourinary: Negative for urgency and frequency.  All other  systems reviewed and are negative.    Allergies  Doxycycline; Flagyl; Penicillins; Shellfish allergy; and Sulfa antibiotics  Home Medications   Current Outpatient Rx  Name Route Sig Dispense Refill  . CLONIDINE HCL 0.1 MG PO TABS Oral Take 0.1 mg by mouth 3 (three) times daily.     Marland Kitchen FLUOXETINE HCL 20 MG PO CAPS Oral Take 40 mg by mouth daily.     Marland Kitchen LAMOTRIGINE 25 MG PO TABS Oral Take 25 mg by mouth daily.    Marland Kitchen LEVALBUTEROL TARTRATE 45 MCG/ACT IN AERO Inhalation Inhale 2 puffs into the lungs every 6 (six) hours as needed for wheezing or shortness of breath. 1 Inhaler 1  . QUETIAPINE FUMARATE 200 MG PO TABS Oral Take 200 mg by mouth at bedtime.      BP 106/70  Pulse 104  Temp(Src) 98.3 F (36.8 C) (Oral)  Resp 18  SpO2 96%  LMP 04/03/2011  Physical Exam  Nursing note and vitals reviewed. Constitutional: She appears well-developed and well-nourished. No distress.  HENT:  Head: Normocephalic and atraumatic.  Right Ear: External ear normal.  Left Ear: External ear normal.  Eyes: Conjunctivae are normal. Right eye exhibits no discharge. Left eye exhibits no discharge. No scleral icterus.  Neck: Neck supple. No tracheal deviation present.  Cardiovascular: Normal rate, regular rhythm and intact  distal pulses.   Pulmonary/Chest: Effort normal and breath sounds normal. No stridor. No respiratory distress. She has no wheezes. She has no rales.  Abdominal: Soft. Bowel sounds are normal. She exhibits no distension. There is no tenderness. There is no rebound and no guarding.  Musculoskeletal: She exhibits no edema and no tenderness.  Neurological: She is alert. She has normal strength. No sensory deficit. Cranial nerve deficit:  no gross defecits noted. She exhibits normal muscle tone. She displays no seizure activity. Coordination normal.  Skin: Skin is warm and dry. No rash noted.  Psychiatric: She has a normal mood and affect.    ED Course  Procedures (including critical care  time)  Labs Reviewed  COMPREHENSIVE METABOLIC PANEL - Abnormal; Notable for the following:    Glucose, Bld 107 (*)    All other components within normal limits  URINALYSIS, ROUTINE W REFLEX MICROSCOPIC - Abnormal; Notable for the following:    pH 8.5 (*)    Protein, ur 30 (*)    All other components within normal limits  CBC  DIFFERENTIAL  LIPASE, BLOOD  URINE MICROSCOPIC-ADD ON   No results found.   1. Abdominal pain       MDM  I have reviewed the patient's old records. She has recurrent episodes of abdominal pain. the etiology of these spells are unclear. At this time there does not appear to be evidence of an acute emergency medical condition. I doubt hepatitis, cholecystitis, appendicitis. Patient is hungry now and would like to eat and drink. She will be discharged home and encouraged to return emergently to 4 symptoms. Recommend that she try to followup with her primary care Dr. for further evaluation.       Celene Kras, MD 05/04/11 [phone]

## 2011-05-04 NOTE — Discharge Instructions (Signed)

## 2011-05-04 NOTE — ED Notes (Signed)
Pt states she started to have have n/v/d last night. Pt states she is also having abdominal pain

## 2011-05-06 ENCOUNTER — Encounter (HOSPITAL_COMMUNITY): Payer: Self-pay | Admitting: Emergency Medicine

## 2011-05-06 ENCOUNTER — Emergency Department (HOSPITAL_COMMUNITY)
Admission: EM | Admit: 2011-05-06 | Discharge: 2011-05-07 | Disposition: A | Discharge status: Home Health | Payer: Self-pay | Attending: Emergency Medicine | Admitting: Emergency Medicine

## 2011-05-06 DIAGNOSIS — R197 Diarrhea, unspecified: Secondary | ICD-10-CM | POA: Insufficient documentation

## 2011-05-06 DIAGNOSIS — F319 Bipolar disorder, unspecified: Secondary | ICD-10-CM | POA: Insufficient documentation

## 2011-05-06 DIAGNOSIS — M129 Arthropathy, unspecified: Secondary | ICD-10-CM | POA: Insufficient documentation

## 2011-05-06 DIAGNOSIS — F17200 Nicotine dependence, unspecified, uncomplicated: Secondary | ICD-10-CM | POA: Insufficient documentation

## 2011-05-06 DIAGNOSIS — R109 Unspecified abdominal pain: Secondary | ICD-10-CM | POA: Insufficient documentation

## 2011-05-06 DIAGNOSIS — I10 Essential (primary) hypertension: Secondary | ICD-10-CM | POA: Insufficient documentation

## 2011-05-06 DIAGNOSIS — R112 Nausea with vomiting, unspecified: Secondary | ICD-10-CM | POA: Insufficient documentation

## 2011-05-06 DIAGNOSIS — K219 Gastro-esophageal reflux disease without esophagitis: Secondary | ICD-10-CM | POA: Insufficient documentation

## 2011-05-06 DIAGNOSIS — Z79899 Other long term (current) drug therapy: Secondary | ICD-10-CM | POA: Insufficient documentation

## 2011-05-06 NOTE — ED Notes (Signed)
Pt reports lower abd pain, nausea, vomiting, and diarrhea since last night.  Seen at [facility] on 4/3 for same.

## 2011-05-07 LAB — URINALYSIS, ROUTINE W REFLEX MICROSCOPIC
Nitrite: NEGATIVE
Specific Gravity, Urine: 1.028 (ref 1.005–1.030)
Urobilinogen, UA: 1 mg/dL (ref 0.0–1.0)

## 2011-05-07 LAB — COMPREHENSIVE METABOLIC PANEL
ALT: 18 U/L (ref 0–35)
AST: 17 U/L (ref 0–37)
Albumin: 3.9 g/dL (ref 3.5–5.2)
CO2: 24 mEq/L (ref 19–32)
Calcium: 9.1 mg/dL (ref 8.4–10.5)
GFR calc non Af Amer: >90 mL/min (ref >90)
Sodium: 139 mEq/L (ref 135–145)

## 2011-05-07 LAB — CBC
MCH: 31.5 pg (ref 26.0–34.0)
Platelets: 256 10*3/uL (ref 150–400)
RBC: 3.91 MIL/uL (ref 3.87–5.11)
RDW: 12.6 % (ref 11.5–15.5)
WBC: 8.3 10*3/uL (ref 4.0–10.5)

## 2011-05-07 LAB — URINE MICROSCOPIC-ADD ON

## 2011-05-07 MED ORDER — MORPHINE SULFATE 4 MG/ML IJ SOLN
4.0000 mg | Freq: Once | INTRAMUSCULAR | Status: AC
  Administered 2011-05-07: 4 mg via INTRAVENOUS
  Filled 2011-05-07: qty 1

## 2011-05-07 MED ORDER — ONDANSETRON HCL 4 MG/2ML IJ SOLN
4.0000 mg | Freq: Once | INTRAMUSCULAR | Status: AC
  Administered 2011-05-07: 4 mg via INTRAVENOUS
  Filled 2011-05-07: qty 2

## 2011-05-07 NOTE — Discharge Instructions (Signed)
Return to the ED with any concerns including fever, vomiting and not able to keep down any liquids, blood in vomit or stool, decreased level of alertness/lethargy, or any other alarming symptoms  You should be sure to keep your follow up appointment that you say you have scheduled.

## 2011-05-07 NOTE — ED Provider Notes (Signed)
History     CSN: [id number]  Arrival date & time 05/06/11  2059   First MD Initiated Contact with Patient 05/07/11 [phone]      Chief Complaint  Patient presents with  . Abdominal Pain    (Consider location/radiation/quality/duration/timing/severity/associated sxs/prior treatment) HPI Pt presents with c/o abdominal pain, nausea/vomiting and diarrhea.  Pt states symptoms began last night.  She has a hx of multiple visits and evaluation for similar symptoms.  She was seen 2 days ago at [facility] ED- she states she felt somewhat improved but then symptoms recurred.  No fever/chills, no dysuria.  Emesis is nonbloody and nonbilious, no blood or melena in stools.  She states she has not been able to keep down liquids today.  There are no other associated systemic symptoms, there are no alleviating or modifying factors.   Past Medical History  Diagnosis Date  . Hypertension   . Bipolar 1 disorder   . Arthritis   . Migraine headache   . Acid reflux     History reviewed. No pertinent past surgical history.  Family History  Problem Relation Age of Onset  . Tuberculosis Mother   . Stroke Mother   . Tuberculosis Father   . Heart disease Father   . Bipolar disorder Sister   . Drug abuse Sister     History  Substance Use Topics  . Smoking status: Current Everyday Smoker  . Smokeless tobacco: Never Used  . Alcohol Use: Yes     occ    OB History    Grav Para Term Preterm Abortions TAB SAB Ect Mult Living                  Review of Systems ROS reviewed and all otherwise negative except for mentioned in HPI  Allergies  Doxycycline; Flagyl; Penicillins; Shellfish allergy; and Sulfa antibiotics  Home Medications   Current Outpatient Rx  Name Route Sig Dispense Refill  . CLONIDINE HCL 0.1 MG PO TABS Oral Take 0.1 mg by mouth 3 (three) times daily.     Marland Kitchen FLUOXETINE HCL 20 MG PO CAPS Oral Take 40 mg by mouth daily.     Marland Kitchen LAMOTRIGINE 25 MG PO TABS Oral Take 25 mg by mouth daily.    Marland Kitchen  LEVALBUTEROL TARTRATE 45 MCG/ACT IN AERO Inhalation Inhale 2 puffs into the lungs every 6 (six) hours as needed. For shortness of breath    . QUETIAPINE FUMARATE 200 MG PO TABS Oral Take 200 mg by mouth at bedtime.      BP 133/80  Pulse 82  Temp(Src) 97.9 F (36.6 C) (Oral)  Resp 18  SpO2 100%  LMP 04/03/2011 Vitals reviewed Physical Exam Physical Examination: General appearance - alert, well appearing, and in no distress Mental status - alert, oriented to person, place, and time Eyes - pupils equal and reactive, no scleral icterus Mouth - mucous membranes moist, pharynx normal without lesions Chest - clear to auscultation, no wheezes, rales or rhonchi, symmetric air entry Heart - normal rate, regular rhythm, normal S1, S2, no murmurs, rubs, clicks or gallops Abdomen - soft, nontender, nondistended, no masses or organomegaly, nabs Extremities - peripheral pulses normal, no pedal edema, no clubbing or cyanosis Skin - normal coloration and turgor, no rashes Psych- normal mood and affect  ED Course  Procedures (including critical care time)  3:41 AM pt has tolerated po trial in the ED without vomiting, abdominal pain is somewhat improved.    Labs Reviewed  URINALYSIS, ROUTINE W REFLEX  MICROSCOPIC - Abnormal; Notable for the following:    APPearance CLOUDY (*)    Hgb urine dipstick LARGE (*)    All other components within normal limits  URINE MICROSCOPIC-ADD ON - Abnormal; Notable for the following:    Squamous Epithelial / LPF MANY (*)    Bacteria, UA FEW (*)    All other components within normal limits  CBC  COMPREHENSIVE METABOLIC PANEL  LIPASE, BLOOD  URINE CULTURE   No results found.   1. Abdominal pain       MDM  Pt presents with recurrence of abdominal pain, n/v/d- labs reassuring- pt had recent ED eval and CT scan for similar symptoms.  Pt feels improved after IV hydration, pain control and antiemetics.  Upon discharge she is asking for a bus pass and a [place]  sandwich.  Discharged with strict return precautions.  Pt agreeable with plan.  Prior records reviewed and considered during this visit- abdominal CT scan 04/11/10- no acute abdominal or pelvic findings.          Ethelda Chick, MD 05/09/11 1034

## 2011-05-09 ENCOUNTER — Ambulatory Visit: Payer: Self-pay | Admitting: Gastroenterology

## 2011-05-09 ENCOUNTER — Telehealth: Payer: Self-pay | Admitting: Gastroenterology

## 2011-05-09 LAB — URINE CULTURE
Colony Count: 85000
Culture  Setup Time: 201304061147

## 2011-05-09 NOTE — Telephone Encounter (Signed)
Message copied by Arna Snipe on Mon May 09, 2011  4:22 PM ------      Message from: Donata Duff      Created: Mon May 09, 2011  3:19 PM       Do not bill

## 2011-05-14 ENCOUNTER — Encounter (HOSPITAL_COMMUNITY): Payer: Self-pay | Admitting: *Deleted

## 2011-05-14 ENCOUNTER — Emergency Department (HOSPITAL_COMMUNITY)
Admission: EM | Admit: 2011-05-14 | Discharge: 2011-05-15 | Disposition: A | Discharge status: Home / Self Care | Payer: Self-pay | Attending: Emergency Medicine | Admitting: Emergency Medicine

## 2011-05-14 DIAGNOSIS — R197 Diarrhea, unspecified: Secondary | ICD-10-CM | POA: Insufficient documentation

## 2011-05-14 DIAGNOSIS — R112 Nausea with vomiting, unspecified: Secondary | ICD-10-CM | POA: Insufficient documentation

## 2011-05-14 DIAGNOSIS — R109 Unspecified abdominal pain: Secondary | ICD-10-CM | POA: Insufficient documentation

## 2011-05-14 DIAGNOSIS — F319 Bipolar disorder, unspecified: Secondary | ICD-10-CM | POA: Insufficient documentation

## 2011-05-14 DIAGNOSIS — I10 Essential (primary) hypertension: Secondary | ICD-10-CM | POA: Insufficient documentation

## 2011-05-14 DIAGNOSIS — K219 Gastro-esophageal reflux disease without esophagitis: Secondary | ICD-10-CM | POA: Insufficient documentation

## 2011-05-14 NOTE — ED Notes (Signed)
The pt has abd pain nv since last pm.  C/o dizziness hyperventilating

## 2011-05-15 LAB — BASIC METABOLIC PANEL
BUN: 22 mg/dL (ref 6–23)
Chloride: 105 mEq/L (ref 96–112)
GFR calc Af Amer: >90 mL/min (ref >90)
Potassium: 3.8 mEq/L (ref 3.5–5.1)
Sodium: 138 mEq/L (ref 135–145)

## 2011-05-15 LAB — URINE MICROSCOPIC-ADD ON

## 2011-05-15 LAB — DIFFERENTIAL
Basophils Relative: 0 % (ref 0–1)
Monocytes Relative: 8 % (ref 3–12)
Neutro Abs: 5.6 10*3/uL (ref 1.7–7.7)
Neutrophils Relative %: 68 % (ref 43–77)

## 2011-05-15 LAB — CBC
Hemoglobin: 11.8 g/dL — ABNORMAL LOW (ref 12.0–15.0)
MCHC: 33.2 g/dL (ref 30.0–36.0)
Platelets: 237 10*3/uL (ref 150–400)
RBC: 3.76 MIL/uL — ABNORMAL LOW (ref 3.87–5.11)

## 2011-05-15 LAB — URINALYSIS, ROUTINE W REFLEX MICROSCOPIC
Glucose, UA: NEGATIVE mg/dL
Ketones, ur: NEGATIVE mg/dL
Protein, ur: NEGATIVE mg/dL

## 2011-05-15 MED ORDER — DICYCLOMINE HCL 10 MG/ML IM SOLN
20.0000 mg | Freq: Once | INTRAMUSCULAR | Status: AC
  Administered 2011-05-15: 20 mg via INTRAMUSCULAR
  Filled 2011-05-15: qty 2

## 2011-05-15 MED ORDER — ONDANSETRON 8 MG PO TBDP: 8.0000 mg | ORAL_TABLET | Freq: Three times a day (TID) | ORAL | Status: DC | PRN

## 2011-05-15 MED ORDER — DICYCLOMINE HCL 20 MG PO TABS: 20.0000 mg | ORAL_TABLET | Freq: Four times a day (QID) | ORAL | Status: DC

## 2011-05-15 MED ORDER — ONDANSETRON HCL 4 MG/2ML IJ SOLN
4.0000 mg | Freq: Once | INTRAMUSCULAR | Status: AC
  Administered 2011-05-15: 4 mg via INTRAVENOUS
  Filled 2011-05-15: qty 2

## 2011-05-15 MED ORDER — PROMETHAZINE HCL 25 MG PO TABS: 25.0000 mg | ORAL_TABLET | Freq: Four times a day (QID) | ORAL | Status: DC | PRN

## 2011-05-15 MED ORDER — MORPHINE SULFATE 4 MG/ML IJ SOLN
6.0000 mg | Freq: Once | INTRAMUSCULAR | Status: AC
  Administered 2011-05-15: 6 mg via INTRAVENOUS
  Filled 2011-05-15: qty 2

## 2011-05-15 NOTE — Discharge Instructions (Signed)
Please take medications as prescribed. It is important for you to establish a local primary care provider. Please followup with gastroenterology as indicated. Return to the emergency department for fevers chills persistent nausea and vomiting type medication new or worsening abdominal pain or other concerning symptoms.  Abdominal Pain Abdominal pain can be caused by many things. Your caregiver decides the seriousness of your pain by an examination and possibly blood tests and X-rays. Many cases can be observed and treated at home. Most abdominal pain is not caused by a disease and will probably improve without treatment. However, in many cases, more time must pass before a clear cause of the pain can be found. Before that point, it may not be known if you need more testing, or if hospitalization or surgery is needed. HOME CARE INSTRUCTIONS   Do not take laxatives unless directed by your caregiver.   Take pain medicine only as directed by your caregiver.   Only take over-the-counter or prescription medicines for pain, discomfort, or fever as directed by your caregiver.   Try a clear liquid diet (broth, tea, or water) for as long as directed by your caregiver. Slowly move to a bland diet as tolerated.  SEEK IMMEDIATE MEDICAL CARE IF:   The pain does not go away.   You have a fever.   You keep throwing up (vomiting).   The pain is felt only in portions of the abdomen. Pain in the right side could possibly be appendicitis. In an adult, pain in the left lower portion of the abdomen could be colitis or diverticulitis.   You pass bloody or black tarry stools.  MAKE SURE YOU:   Understand these instructions.   Will watch your condition.   Will get help right away if you are not doing well or get worse.  Document Released: 10/27/2004 Document Revised: 01/06/2011 Document Reviewed: 09/05/2007 [facility] Patient Information 2012 [place], [place].  Pain of Unknown Etiology (Pain Without a Known  Cause) You have come to your caregiver because of pain. Pain can occur in any part of the body. Often there is not a definite cause. If your laboratory (blood or urine) work was normal and x-rays or other studies were normal, your caregiver may treat you without knowing the cause of the pain. An example of this is the headache. Most headaches are diagnosed by taking a history. This means your caregiver asks you questions about your headaches. Your caregiver determines a treatment based on your answers. Usually testing done for headaches is normal. Often testing is not done unless there is no response to medications. Regardless of where your pain is located today, you can be given medications to make you comfortable. If no physical cause of pain can be found, most cases of pain will gradually leave as suddenly as they came.  If you have a painful condition and no reason can be found for the pain, It is importantthat you follow up with your caregiver. If the pain becomes worse or does not go away, it may be necessary to repeat tests and look further for a possible cause.  Only take over-the-counter or prescription medicines for pain, discomfort, or fever as directed by your caregiver.   For the protection of your privacy, test results can not be given over the phone. Make sure you receive the results of your test. Ask as to how these results are to be obtained if you have not been informed. It is your responsibility to obtain your test results.  You may continue all activities unless the activities cause more pain. When the pain lessens, it is important to gradually resume normal activities. Resume activities by beginning slowly and gradually increasing the intensity and duration of the activities or exercise. During periods of severe pain, bed-rest may be helpful. Lay or sit in any position that is comfortable.   Ice used for acute (sudden) conditions may be effective. Use a large plastic bag filled with  ice and wrapped in a towel. This may provide pain relief.   See your caregiver for continued problems. They can help or refer you for exercises or physical therapy if necessary.  If you were given medications for your condition, do not drive, operate machinery or power tools, or sign legal documents for 24 hours. Do not drink alcohol, take sleeping pills, or take other medications that may interfere with treatment. See your caregiver immediately if you have pain that is becoming worse and not relieved by medications. Document Released: 10/12/2000 Document Revised: 01/06/2011 Document Reviewed: 01/17/2005 [facility] Patient Information 2012 [place], [place].

## 2011-05-15 NOTE — ED Provider Notes (Signed)
History     CSN: [id number]  Arrival date & time 05/14/11  2040   First MD Initiated Contact with Patient 05/15/11 0015      Chief Complaint  Patient presents with  . Emesis    (Consider location/radiation/quality/duration/timing/severity/associated sxs/prior treatment) HPI 51 year old female presents to emergency room with complaint of lower abdominal pain along with nausea vomiting and diarrhea. Patient reports onset of symptoms on Friday evening. Patient has had 3 episodes of loose stool. She has had multiple episodes of vomiting. She reports the pain is sharp and crampy in nature. Patient has had similar outs of this pain for the last several months. She has had multiple ER evaluations for same. She has had a CT scan and a hiatus scan within the last month that have been nondiagnostic. Patient has not seen any blood in her emesis or stool. She does not have a primary care doctor, and has not been seen by a gastroenterologist. She has had no fevers. Pain is intermittent. Pain today is no different than prior presentations to the ER in the past several weeks. Past Medical History  Diagnosis Date  . Hypertension   . Bipolar 1 disorder   . Arthritis   . Migraine headache   . Acid reflux     History reviewed. No pertinent past surgical history.  Family History  Problem Relation Age of Onset  . Tuberculosis Mother   . Stroke Mother   . Tuberculosis Father   . Heart disease Father   . Bipolar disorder Sister   . Drug abuse Sister     History  Substance Use Topics  . Smoking status: Current Everyday Smoker  . Smokeless tobacco: Never Used  . Alcohol Use: Yes     occ    OB History    Grav Para Term Preterm Abortions TAB SAB Ect Mult Living                  Review of Systems  All other systems reviewed and are negative.   other than listed in the history of present illness  Allergies  Doxycycline; Flagyl; Penicillins; Shellfish allergy; and Sulfa  antibiotics  Home Medications   Current Outpatient Rx  Name Route Sig Dispense Refill  . CLONIDINE HCL 0.1 MG PO TABS Oral Take 0.1 mg by mouth 3 (three) times daily.     Marland Kitchen FLUOXETINE HCL 20 MG PO CAPS Oral Take 40 mg by mouth daily.     Marland Kitchen LAMOTRIGINE 25 MG PO TABS Oral Take 25 mg by mouth daily.    Marland Kitchen LEVALBUTEROL TARTRATE 45 MCG/ACT IN AERO Inhalation Inhale 2 puffs into the lungs every 6 (six) hours as needed. For shortness of breath    . QUETIAPINE FUMARATE 200 MG PO TABS Oral Take 200 mg by mouth at bedtime.    Marland Kitchen DICYCLOMINE HCL 20 MG PO TABS Oral Take 1 tablet (20 mg total) by mouth every 6 (six) hours. 20 tablet 0  . ONDANSETRON 8 MG PO TBDP Oral Take 1 tablet (8 mg total) by mouth every 8 (eight) hours as needed for nausea. 20 tablet 0  . PROMETHAZINE HCL 25 MG PO TABS Oral Take 1 tablet (25 mg total) by mouth every 6 (six) hours as needed for nausea. 10 tablet 0  . PROMETHAZINE HCL 25 MG PO TABS Oral Take 1 tablet (25 mg total) by mouth every 6 (six) hours as needed for nausea. 12 tablet 0  . PROMETHAZINE HCL 25 MG PO TABS Oral  Take 1 tablet (25 mg total) by mouth every 6 (six) hours as needed for nausea. 30 tablet 0    BP 134/86  Pulse 83  Temp(Src) 99 F (37.2 C) (Oral)  Resp 18  SpO2 97%  LMP 04/03/2011  Physical Exam  Nursing note and vitals reviewed. Constitutional: She is oriented to person, place, and time. She appears well-developed and well-nourished.  HENT:  Head: Normocephalic and atraumatic.  Nose: Nose normal.  Mouth/Throat: Oropharynx is clear and moist.  Eyes: Conjunctivae and EOM are normal. Pupils are equal, round, and reactive to light.  Neck: Normal range of motion. Neck supple. No JVD present. No tracheal deviation present. No thyromegaly present.  Cardiovascular: Normal rate, regular rhythm, normal heart sounds and intact distal pulses.  Exam reveals no gallop and no friction rub.   No murmur heard. Pulmonary/Chest: Effort normal and breath sounds  normal. No stridor. No respiratory distress. She has no wheezes. She has no rales. She exhibits no tenderness.  Abdominal: Soft. Bowel sounds are normal. She exhibits no distension and no mass. There is tenderness (diffuse tenderness along lower abdomen left greater than right soft no rebound no guarding). There is no rebound and no guarding.  Musculoskeletal: Normal range of motion. She exhibits no edema and no tenderness.  Lymphadenopathy:    She has no cervical adenopathy.  Neurological: She is oriented to person, place, and time. She exhibits normal muscle tone. Coordination normal.  Skin: Skin is dry. No rash noted. No erythema. No pallor.  Psychiatric: She has a normal mood and affect. Her behavior is normal. Judgment and thought content normal.    ED Course  Procedures (including critical care time)  Labs Reviewed  URINALYSIS, ROUTINE W REFLEX MICROSCOPIC - Abnormal; Notable for the following:    APPearance CLOUDY (*)    Hgb urine dipstick MODERATE (*)    All other components within normal limits  URINE MICROSCOPIC-ADD ON - Abnormal; Notable for the following:    Squamous Epithelial / LPF MANY (*)    Bacteria, UA MANY (*)    All other components within normal limits  CBC - Abnormal; Notable for the following:    RBC 3.76 (*)    Hemoglobin 11.8 (*)    HCT 35.5 (*)    All other components within normal limits  BASIC METABOLIC PANEL - Abnormal; Notable for the following:    Glucose, Bld 106 (*)    All other components within normal limits  DIFFERENTIAL   No results found.   1. Abdominal pain       MDM  51 year old female with acute on chronic lower abdominal pain with multiple workups in the past in the emergency department. Imaging studies and lab work thus far has been negative. Will start patient on Bentyl Zofran and Phenergan. Patient has been referred to gastroenterology and strongly suggested to find a local primary care physician. Abdomen is soft non-acute do not  feel patient has diverticulitis, mesenteric ischemia, or other life-threatening causes of her pain        Olivia Mackie, MD 05/15/11 [phone]

## 2011-05-15 NOTE — ED Notes (Signed)
Patient states she has had N/V/D for the past couple of days.  Also c/o lower abd pain.  Last vomited while sitting in the waiting room.

## 2011-05-18 ENCOUNTER — Emergency Department (HOSPITAL_COMMUNITY)
Admission: EM | Admit: 2011-05-18 | Discharge: 2011-05-19 | Disposition: A | Discharge status: Home / Self Care | Payer: Self-pay | Attending: Emergency Medicine | Admitting: Emergency Medicine

## 2011-05-18 ENCOUNTER — Encounter (HOSPITAL_COMMUNITY): Payer: Self-pay | Admitting: Emergency Medicine

## 2011-05-18 DIAGNOSIS — R109 Unspecified abdominal pain: Secondary | ICD-10-CM | POA: Insufficient documentation

## 2011-05-18 DIAGNOSIS — I10 Essential (primary) hypertension: Secondary | ICD-10-CM | POA: Insufficient documentation

## 2011-05-18 DIAGNOSIS — R197 Diarrhea, unspecified: Secondary | ICD-10-CM | POA: Insufficient documentation

## 2011-05-18 DIAGNOSIS — R112 Nausea with vomiting, unspecified: Secondary | ICD-10-CM | POA: Insufficient documentation

## 2011-05-18 DIAGNOSIS — R10819 Abdominal tenderness, unspecified site: Secondary | ICD-10-CM | POA: Insufficient documentation

## 2011-05-18 LAB — CBC
HCT: 34.7 % — ABNORMAL LOW (ref 36.0–46.0)
Hemoglobin: 11.6 g/dL — ABNORMAL LOW (ref 12.0–15.0)
MCHC: 33.4 g/dL (ref 30.0–36.0)
RBC: 3.69 MIL/uL — ABNORMAL LOW (ref 3.87–5.11)
WBC: 7.7 10*3/uL (ref 4.0–10.5)

## 2011-05-18 LAB — DIFFERENTIAL
Lymphocytes Relative: 27 % (ref 12–46)
Lymphs Abs: 2.1 10*3/uL (ref 0.7–4.0)
Monocytes Absolute: 0.9 10*3/uL (ref 0.1–1.0)
Monocytes Relative: 11 % (ref 3–12)
Neutro Abs: 4.6 10*3/uL (ref 1.7–7.7)

## 2011-05-18 NOTE — ED Notes (Signed)
PT. REPORTS EMESIS WITH DIARRHEA AND LOW ABDOMINAL PAIN  AND BODY ACHES ONSET TODAY .

## 2011-05-19 ENCOUNTER — Emergency Department (HOSPITAL_COMMUNITY)
Admission: EM | Admit: 2011-05-19 | Discharge: 2011-05-20 | Disposition: A | Discharge status: Home / Self Care | Payer: Self-pay | Attending: Emergency Medicine | Admitting: Emergency Medicine

## 2011-05-19 ENCOUNTER — Encounter (HOSPITAL_COMMUNITY): Payer: Self-pay | Admitting: Emergency Medicine

## 2011-05-19 DIAGNOSIS — R112 Nausea with vomiting, unspecified: Secondary | ICD-10-CM | POA: Insufficient documentation

## 2011-05-19 DIAGNOSIS — K219 Gastro-esophageal reflux disease without esophagitis: Secondary | ICD-10-CM | POA: Insufficient documentation

## 2011-05-19 DIAGNOSIS — F319 Bipolar disorder, unspecified: Secondary | ICD-10-CM | POA: Insufficient documentation

## 2011-05-19 DIAGNOSIS — F17200 Nicotine dependence, unspecified, uncomplicated: Secondary | ICD-10-CM | POA: Insufficient documentation

## 2011-05-19 DIAGNOSIS — R109 Unspecified abdominal pain: Secondary | ICD-10-CM | POA: Insufficient documentation

## 2011-05-19 DIAGNOSIS — I10 Essential (primary) hypertension: Secondary | ICD-10-CM | POA: Insufficient documentation

## 2011-05-19 DIAGNOSIS — R1110 Vomiting, unspecified: Secondary | ICD-10-CM

## 2011-05-19 DIAGNOSIS — R197 Diarrhea, unspecified: Secondary | ICD-10-CM | POA: Insufficient documentation

## 2011-05-19 LAB — URINALYSIS, ROUTINE W REFLEX MICROSCOPIC
Bilirubin Urine: NEGATIVE
Glucose, UA: NEGATIVE mg/dL
Ketones, ur: NEGATIVE mg/dL
Specific Gravity, Urine: 1.027 (ref 1.005–1.030)
pH: 5.5 (ref 5.0–8.0)

## 2011-05-19 LAB — BASIC METABOLIC PANEL
BUN: 16 mg/dL (ref 6–23)
CO2: 24 mEq/L (ref 19–32)
Chloride: 103 mEq/L (ref 96–112)
Creatinine, Ser: 0.75 mg/dL (ref 0.50–1.10)
Glucose, Bld: 103 mg/dL — ABNORMAL HIGH (ref 70–99)
Potassium: 3.7 mEq/L (ref 3.5–5.1)

## 2011-05-19 LAB — URINE MICROSCOPIC-ADD ON

## 2011-05-19 MED ORDER — ONDANSETRON 4 MG PO TBDP
8.0000 mg | ORAL_TABLET | Freq: Once | ORAL | Status: AC
  Administered 2011-05-19: 8 mg via ORAL
  Filled 2011-05-19: qty 2

## 2011-05-19 MED ORDER — SODIUM CHLORIDE 0.9 % IV BOLUS (SEPSIS)
1000.0000 mL | Freq: Once | INTRAVENOUS | Status: AC
  Administered 2011-05-19: 1000 mL via INTRAVENOUS

## 2011-05-19 MED ORDER — ONDANSETRON 8 MG PO TBDP: 8.0000 mg | ORAL_TABLET | Freq: Three times a day (TID) | ORAL | Status: DC | PRN

## 2011-05-19 MED ORDER — MORPHINE SULFATE 4 MG/ML IJ SOLN
6.0000 mg | Freq: Once | INTRAMUSCULAR | Status: AC
  Administered 2011-05-19: 6 mg via INTRAVENOUS
  Filled 2011-05-19: qty 2

## 2011-05-19 MED ORDER — PROMETHAZINE HCL 25 MG/ML IJ SOLN
25.0000 mg | Freq: Once | INTRAMUSCULAR | Status: AC
  Administered 2011-05-19: 25 mg via INTRAVENOUS
  Filled 2011-05-19: qty 1

## 2011-05-19 NOTE — Discharge Instructions (Signed)
Follow up with gastroenterology as scheduled. Please take Zofran as prescribed for nausea if the Phenergan is not working. Stick to a bland diet. Return to the emergency department for fever, chills, vomiting despite medications or new concerning symptoms.  Pain of Unknown Etiology (Pain Without a Known Cause) You have come to your caregiver because of pain. Pain can occur in any part of the body. Often there is not a definite cause. If your laboratory (blood or urine) work was normal and x-rays or other studies were normal, your caregiver may treat you without knowing the cause of the pain. An example of this is the headache. Most headaches are diagnosed by taking a history. This means your caregiver asks you questions about your headaches. Your caregiver determines a treatment based on your answers. Usually testing done for headaches is normal. Often testing is not done unless there is no response to medications. Regardless of where your pain is located today, you can be given medications to make you comfortable. If no physical cause of pain can be found, most cases of pain will gradually leave as suddenly as they came.  If you have a painful condition and no reason can be found for the pain, It is importantthat you follow up with your caregiver. If the pain becomes worse or does not go away, it may be necessary to repeat tests and look further for a possible cause.  Only take over-the-counter or prescription medicines for pain, discomfort, or fever as directed by your caregiver.   For the protection of your privacy, test results can not be given over the phone. Make sure you receive the results of your test. Ask as to how these results are to be obtained if you have not been informed. It is your responsibility to obtain your test results.   You may continue all activities unless the activities cause more pain. When the pain lessens, it is important to gradually resume normal activities. Resume  activities by beginning slowly and gradually increasing the intensity and duration of the activities or exercise. During periods of severe pain, bed-rest may be helpful. Lay or sit in any position that is comfortable.   Ice used for acute (sudden) conditions may be effective. Use a large plastic bag filled with ice and wrapped in a towel. This may provide pain relief.   See your caregiver for continued problems. They can help or refer you for exercises or physical therapy if necessary.  If you were given medications for your condition, do not drive, operate machinery or power tools, or sign legal documents for 24 hours. Do not drink alcohol, take sleeping pills, or take other medications that may interfere with treatment. See your caregiver immediately if you have pain that is becoming worse and not relieved by medications. Document Released: 10/12/2000 Document Revised: 01/06/2011 Document Reviewed: 01/17/2005 [facility] Patient Information 2012 [place], [place].  Abdominal Pain (Nonspecific) Your exam might not show the exact reason you have abdominal pain. Since there are many different causes of abdominal pain, another checkup and more tests may be needed. It is very important to follow up for lasting (persistent) or worsening symptoms. A possible cause of abdominal pain in any person who still has his or her appendix is acute appendicitis. Appendicitis is often hard to diagnose. Normal blood tests, urine tests, ultrasound, and CT scans do not completely rule out early appendicitis or other causes of abdominal pain. Sometimes, only the changes that happen over time will allow appendicitis and other causes  of abdominal pain to be determined. Other potential problems that may require surgery may also take time to become more apparent. Because of this, it is important that you follow all of the instructions below. HOME CARE INSTRUCTIONS   Rest as much as possible.   Do not eat solid food until your  pain is gone.   While adults or children have pain: A diet of water, weak decaffeinated tea, broth or bouillon, gelatin, oral rehydration solutions (ORS), frozen ice pops, or ice chips may be helpful.   When pain is gone in adults or children: Start a light diet (dry toast, crackers, applesauce, or white rice). Increase the diet slowly as long as it does not bother you. Eat no dairy products (including cheese and eggs) and no spicy, fatty, fried, or high-fiber foods.   Use no alcohol, caffeine, or cigarettes.   Take your regular medicines unless your caregiver told you not to.   Take any prescribed medicine as directed.   Only take over-the-counter or prescription medicines for pain, discomfort, or fever as directed by your caregiver. Do not give aspirin to children.  If your caregiver has given you a follow-up appointment, it is very important to keep that appointment. Not keeping the appointment could result in a permanent injury and/or lasting (chronic) pain and/or disability. If there is any problem keeping the appointment, you must call to reschedule.  SEEK IMMEDIATE MEDICAL CARE IF:   Your pain is not gone in 24 hours.   Your pain becomes worse, changes location, or feels different.   You or your child has an oral temperature above 102 F (38.9 C), not controlled by medicine.   Your baby is older than 3 months with a rectal temperature of 102 F (38.9 C) or higher.   Your baby is 10 months old or younger with a rectal temperature of 100.4 F (38 C) or higher.   You have shaking chills.   You keep throwing up (vomiting) or cannot drink liquids.   There is blood in your vomit or you see blood in your bowel movements.   Your bowel movements become dark or black.   You have frequent bowel movements.   Your bowel movements stop (become blocked) or you cannot pass gas.   You have bloody, frequent, or painful urination.   You have yellow discoloration in the skin or whites of  the eyes.   Your stomach becomes bloated or bigger.   You have dizziness or fainting.   You have chest or back pain.  MAKE SURE YOU:   Understand these instructions.   Will watch your condition.   Will get help right away if you are not doing well or get worse.  Document Released: 01/17/2005 Document Revised: 01/06/2011 Document Reviewed: 12/15/2008 [facility] Patient Information 2012 [place], [place].

## 2011-05-19 NOTE — ED Notes (Signed)
Patient states abdomen pain with nausea and vomiting x 1 day. Patient states the pain is a stabbing pain and vomiting x 20 episodes and chest pain. Family at bedside. EDP at bedside.

## 2011-05-19 NOTE — ED Notes (Signed)
Patient states abdomen pain 10/10 with nausea and vomiting x 1 day. Patient states the pain is a stabbing pain and vomiting x 20 episodes. Family at bedside. Pt given emesis bag, noted urge to spit, increased salivation but not vomiting at this time.

## 2011-05-19 NOTE — ED Provider Notes (Signed)
History     CSN: [id number]  Arrival date & time 05/18/11  2313   First MD Initiated Contact with Patient 05/19/11 0125      Chief Complaint  Patient presents with  . Emesis    (Consider location/radiation/quality/duration/timing/severity/associated sxs/prior treatment) HPI 51 year old female presents emergency department with return of her chronic abdominal pain along with nausea vomiting diarrhea. Patient was seen by me 4 days ago for same presentation. Patient reports she has followup with GI next week. She denies any fever. Patient is no longer taking Bentyl I prescribed to her, and she says it makes her feel funny. Patient has not taken her Phenergan suppositories as she does not like putting things in her rectum. She has been unable to keep down her Phenergan tablets. Abdominal pain is diffuse, crampy in nature. Patient has been seen for the same complaints multiple times in the past the emergency department, has had negative CT scan several times, negative HIDA scan. Patient noted to have blood in her urine today, however she is on her period. No other new complaints at this time Past Medical History  Diagnosis Date  . Hypertension   . Bipolar 1 disorder   . Arthritis   . Migraine headache   . Acid reflux     History reviewed. No pertinent past surgical history.  Family History  Problem Relation Age of Onset  . Tuberculosis Mother   . Stroke Mother   . Tuberculosis Father   . Heart disease Father   . Bipolar disorder Sister   . Drug abuse Sister     History  Substance Use Topics  . Smoking status: Current Everyday Smoker  . Smokeless tobacco: Never Used  . Alcohol Use: Yes     occ    OB History    Grav Para Term Preterm Abortions TAB SAB Ect Mult Living                  Review of Systems  All other systems reviewed and are negative.    Allergies  Doxycycline; Flagyl; Penicillins; Shellfish allergy; and Sulfa antibiotics  Home Medications   Current  Outpatient Rx  Name Route Sig Dispense Refill  . CLONIDINE HCL 0.1 MG PO TABS Oral Take 0.1 mg by mouth 3 (three) times daily.     Marland Kitchen LAMOTRIGINE 25 MG PO TABS Oral Take 25 mg by mouth daily.    Marland Kitchen LEVALBUTEROL TARTRATE 45 MCG/ACT IN AERO Inhalation Inhale 2 puffs into the lungs every 6 (six) hours as needed. For shortness of breath    . QUETIAPINE FUMARATE 200 MG PO TABS Oral Take 200 mg by mouth at bedtime.    Marland Kitchen PROMETHAZINE HCL 25 MG PO TABS Oral Take 1 tablet (25 mg total) by mouth every 6 (six) hours as needed for nausea. 10 tablet 0  . PROMETHAZINE HCL 25 MG PO TABS Oral Take 1 tablet (25 mg total) by mouth every 6 (six) hours as needed for nausea. 12 tablet 0    BP 126/84  Pulse 102  Temp 98.4 F (36.9 C)  Resp 16  SpO2 97%  LMP 05/15/2011  Physical Exam  Nursing note and vitals reviewed. Constitutional: She is oriented to person, place, and time. She appears well-developed and well-nourished.  HENT:  Head: Normocephalic and atraumatic.  Nose: Nose normal.  Mouth/Throat: Oropharynx is clear and moist.  Eyes: Conjunctivae and EOM are normal. Pupils are equal, round, and reactive to light.  Neck: Normal range of motion. Neck supple.  No JVD present. No tracheal deviation present. No thyromegaly present.  Cardiovascular: Normal rate, regular rhythm, normal heart sounds and intact distal pulses.  Exam reveals no gallop and no friction rub.   No murmur heard. Pulmonary/Chest: Effort normal and breath sounds normal. No stridor. No respiratory distress. She has no wheezes. She has no rales. She exhibits no tenderness.  Abdominal: Soft. She exhibits no distension and no mass. There is tenderness (diffuse tenderness throughout the abdomen without focality). There is no rebound and no guarding.       Hyperactive bowel sounds  Musculoskeletal: Normal range of motion. She exhibits no edema and no tenderness.  Lymphadenopathy:    She has no cervical adenopathy.  Neurological: She is  oriented to person, place, and time. She exhibits normal muscle tone. Coordination normal.  Skin: Skin is dry. No rash noted. No erythema. No pallor.  Psychiatric: She has a normal mood and affect. Her behavior is normal. Judgment and thought content normal.    ED Course  Procedures (including critical care time)  Labs Reviewed  CBC - Abnormal; Notable for the following:    RBC 3.69 (*)    Hemoglobin 11.6 (*)    HCT 34.7 (*)    All other components within normal limits  BASIC METABOLIC PANEL - Abnormal; Notable for the following:    Glucose, Bld 103 (*)    All other components within normal limits  URINALYSIS, ROUTINE W REFLEX MICROSCOPIC - Abnormal; Notable for the following:    APPearance CLOUDY (*)    Hgb urine dipstick LARGE (*)    All other components within normal limits  URINE MICROSCOPIC-ADD ON - Abnormal; Notable for the following:    Squamous Epithelial / LPF FEW (*)    Crystals CA OXALATE CRYSTALS (*)    All other components within normal limits  DIFFERENTIAL  POCT PREGNANCY, URINE   No results found.   No diagnosis found.    MDM  51 year old female here with acute on chronic abdominal pain with nausea vomiting and diarrhea. No signs of dehydration on exam. Lab work is unremarkable with RBC seen in urine, slightly elevated glucose, and mild anemia. Will treat for nausea and vomiting and pain. Patient encouraged to followup with her gastroenterologist as scheduled        Olivia Mackie, MD 05/19/11 1610

## 2011-05-20 LAB — URINALYSIS, ROUTINE W REFLEX MICROSCOPIC
Bilirubin Urine: NEGATIVE
Glucose, UA: NEGATIVE mg/dL
Ketones, ur: NEGATIVE mg/dL
Leukocytes, UA: NEGATIVE
Nitrite: NEGATIVE
Protein, ur: NEGATIVE mg/dL
Specific Gravity, Urine: 1.025 (ref 1.005–1.030)
Urobilinogen, UA: 0.2 mg/dL (ref 0.0–1.0)
pH: 6 (ref 5.0–8.0)

## 2011-05-20 LAB — POCT I-STAT, CHEM 8
BUN: 11 mg/dL (ref 6–23)
Calcium, Ion: 1.29 mmol/L (ref 1.12–1.32)
Chloride: 106 mEq/L (ref 96–112)
HCT: 32 % — ABNORMAL LOW (ref 36.0–46.0)
Potassium: 3.7 mEq/L (ref 3.5–5.1)

## 2011-05-20 LAB — URINE MICROSCOPIC-ADD ON

## 2011-05-20 MED ORDER — HYDROCODONE-ACETAMINOPHEN 5-325 MG PO TABS: 1.0000 | ORAL_TABLET | ORAL | Status: DC | PRN

## 2011-05-20 MED ORDER — ONDANSETRON HCL 4 MG/2ML IJ SOLN
4.0000 mg | Freq: Once | INTRAMUSCULAR | Status: AC
  Administered 2011-05-20: 4 mg via INTRAVENOUS
  Filled 2011-05-20: qty 2

## 2011-05-20 MED ORDER — MORPHINE SULFATE 4 MG/ML IJ SOLN
4.0000 mg | Freq: Once | INTRAMUSCULAR | Status: AC
  Administered 2011-05-20: 4 mg via INTRAVENOUS
  Filled 2011-05-20: qty 1

## 2011-05-20 MED ORDER — MORPHINE SULFATE 2 MG/ML IJ SOLN
2.0000 mg | Freq: Once | INTRAMUSCULAR | Status: AC
  Administered 2011-05-20: 2 mg via INTRAVENOUS
  Filled 2011-05-20: qty 1

## 2011-05-20 MED ORDER — PROMETHAZINE HCL 25 MG PO TABS: 25.0000 mg | ORAL_TABLET | Freq: Four times a day (QID) | ORAL | Status: DC | PRN

## 2011-05-20 MED ORDER — SODIUM CHLORIDE 0.9 % IV BOLUS (SEPSIS)
1000.0000 mL | Freq: Once | INTRAVENOUS | Status: AC
  Administered 2011-05-20: 1000 mL via INTRAVENOUS

## 2011-05-20 NOTE — Discharge Instructions (Signed)
Please review the instructions below. Your urine test shows you were mildly dehydrated, but no urine infection. Your chemistry panel was normal. Take the medications for pain and nausea as directed and keep your appointment with Dr Christella Hartigan on 05/26/2011 as scheduled.  Abdominal Pain Many things can cause belly (abdominal) pain. Most times, the belly pain is not dangerous. The amount of belly pain does not tell how serious the problem may be. Many cases of belly pain can be watched and treated at home. HOME CARE   Do not take medicines that help you go poop (laxatives) unless told to by your doctor.   Only take medicine as told by your doctor.   Eat or drink as told by your doctor. Your doctor will tell you if you should be on a special diet.  GET HELP RIGHT AWAY IF:   The pain does not go away.   You have a fever.   You keep throwing up (vomiting).   The pain changes and is only in the right or left part of the belly.   You have bloody or tarry looking poop.  MAKE SURE YOU:   Understand these instructions.   Will watch your condition.   Will get help right away if you are not doing well or get worse.  Document Released: 07/06/2007 Document Revised: 01/06/2011 Document Reviewed: 02/02/2009 B.R.A.T. Diet Your doctor has recommended the B.R.A.T. diet for you or your child until the condition improves. This is often used to help control diarrhea and vomiting symptoms. If you or your child can tolerate clear liquids, you may have:  Bananas.   Rice.   Applesauce.   Toast (and other simple starches such as crackers, potatoes, noodles).  Be sure to avoid dairy products, meats, and fatty foods until symptoms are better. Fruit juices such as apple, grape, and prune juice can make diarrhea worse. Avoid these. Continue this diet for 2 days or as instructed by your caregiver. Document Released: 01/17/2005 Document Revised: 01/06/2011 Document Reviewed: 07/06/2006 [facility] Patient  Information 2012 [place], [place].Nausea and Vomiting Nausea means you feel sick to your stomach. Throwing up (vomiting) is a reflex where stomach contents come out of your mouth. HOME CARE   Take medicine as told by your doctor.   Do not force yourself to eat. However, you do need to drink fluids.   If you feel like eating, eat a normal diet as told by your doctor.   Eat rice, wheat, potatoes, bread, lean meats, yogurt, fruits, and vegetables.   Avoid high-fat foods.   Drink enough fluids to keep your pee (urine) clear or pale yellow.   Ask your doctor how to replace body fluid losses (rehydrate). Signs of body fluid loss (dehydration) include:   Feeling very thirsty.   Dry lips and mouth.   Feeling dizzy.   Dark pee.   Peeing less than normal.   Feeling confused.   Fast breathing or heart rate.  GET HELP RIGHT AWAY IF:   You have blood in your throw up.   You have black or bloody poop (stool).   You have a bad headache or stiff neck.   You feel confused.   You have bad belly (abdominal) pain.   You have chest pain or trouble breathing.   You do not pee at least once every 8 hours.   You have cold, clammy skin.   You keep throwing up after 24 to 48 hours.   You have a fever.  MAKE SURE  YOU:   Understand these instructions.   Will watch your condition.   Will get help right away if you are not doing well or get worse.  Document Released: 07/06/2007 Document Revised: 01/06/2011 Document Reviewed: 06/18/2010 [facility] Patient Information 2012 [place], [place].Diet for Diarrhea, Adult Having frequent, runny stools (diarrhea) has many causes. Diarrhea may be caused or worsened by food or drink. Diarrhea may be relieved by changing your diet. IF YOU ARE NOT TOLERATING SOLID FOODS:  Drink enough water and fluids to keep your urine clear or pale yellow.   Avoid sugary drinks and sodas as well as milk-based beverages.   Avoid beverages containing caffeine and  alcohol.   You may try rehydrating beverages. You can make your own by following this recipe:    tsp table salt.    tsp baking soda.   ? tsp salt substitute (potassium chloride).   1 tbs + 1 tsp sugar.   1 qt water.  As your stools become more solid, you can start eating solid foods. Add foods one at a time. If a certain food causes your diarrhea to get worse, avoid that food and try other foods. A low fiber, low-fat, and lactose-free diet is recommended. Small, frequent meals may be better tolerated.  Starches  Allowed:  White, [place], and pita breads, plain rolls, buns, bagels. Plain muffins, matzo. Soda, saltine, or graham crackers. Pretzels, melba toast, zwieback. Cooked cereals made with water: cornmeal, farina, cream cereals. Dry cereals: refined corn, wheat, rice. Potatoes prepared any way without skins, refined macaroni, spaghetti, noodles, refined rice.   Avoid:  Bread, rolls, or crackers made with whole wheat, multi-grains, rye, bran seeds, nuts, or coconut. Corn tortillas or taco shells. Cereals containing whole grains, multi-grains, bran, coconut, nuts, or raisins. Cooked or dry oatmeal. Coarse wheat cereals, granola. Cereals advertised as "high-fiber." Potato skins. Whole grain pasta, wild or brown rice. Popcorn. Sweet potatoes/yams. Sweet rolls, doughnuts, waffles, pancakes, sweet breads.  Vegetables  Allowed: Strained tomato and vegetable juices. Most well-cooked and canned vegetables without seeds. Fresh: Tender lettuce, cucumber without the skin, cabbage, spinach, bean sprouts.   Avoid: Fresh, cooked, or canned: Artichokes, baked beans, beet greens, broccoli, Brussels sprouts, corn, kale, legumes, peas, sweet potatoes. Cooked: Green or red cabbage, spinach. Avoid large servings of any vegetables, because vegetables shrink when cooked, and they contain more fiber per serving than fresh vegetables.  Fruit  Allowed: All fruit juices except prune juice. Cooked or canned:  Apricots, applesauce, cantaloupe, cherries, fruit cocktail, grapefruit, grapes, kiwi, mandarin oranges, peaches, pears, plums, watermelon. Fresh: Apples without skin, ripe banana, grapes, cantaloupe, cherries, grapefruit, peaches, oranges, plums. Keep servings limited to  cup or 1 piece.   Avoid: Fresh: Apple with skin, apricots, mango, pears, raspberries, strawberries. Prune juice, stewed or dried prunes. Dried fruits, raisins, dates. Large servings of all fresh fruits.  Meat and Meat Substitutes  Allowed: Ground or well-cooked tender beef, ham, veal, lamb, pork, or poultry. Eggs, plain cheese. Fish, oysters, shrimp, lobster, other seafoods. Liver, organ meats.   Avoid: Tough, fibrous meats with gristle. Peanut butter, smooth or chunky. Cheese, nuts, seeds, legumes, dried peas, beans, lentils.  Milk  Allowed: Yogurt, lactose-free milk, kefir, drinkable yogurt, buttermilk, soy milk.   Avoid: Milk, chocolate milk, beverages made with milk, such as milk shakes.  Soups  Allowed: Bouillon, broth, or soups made from allowed foods. Any strained soup.   Avoid: Soups made from vegetables that are not allowed, cream or milk-based soups.  Desserts and Sweets  Allowed: Sugar-free gelatin, sugar-free frozen ice pops made without sugar alcohol.   Avoid: Plain cakes and cookies, pie made with allowed fruit, pudding, custard, cream pie. Gelatin, fruit, ice, sherbet, frozen ice pops. Ice cream, ice milk without nuts. Plain hard candy, honey, jelly, molasses, syrup, sugar, chocolate syrup, gumdrops, marshmallows.  Fats and Oils  Allowed: Avoid any fats and oils.   Avoid: Seeds, nuts, olives, avocados. Margarine, butter, cream, mayonnaise, salad oils, plain salad dressings made from allowed foods. Plain gravy, crisp bacon without rind.  Beverages  Allowed: Water, decaffeinated teas, oral rehydration solutions, sugar-free beverages.   Avoid: Fruit juices, caffeinated beverages (coffee, tea, soda or  pop), alcohol, sports drinks, or lemon-lime soda or pop.  Condiments  Allowed: Ketchup, mustard, horseradish, vinegar, cream sauce, cheese sauce, cocoa powder. Spices in moderation: allspice, basil, bay leaves, celery powder or leaves, cinnamon, cumin powder, curry powder, ginger, mace, marjoram, onion or garlic powder, oregano, paprika, parsley flakes, ground pepper, rosemary, sage, savory, tarragon, thyme, turmeric.   Avoid: Coconut, honey.  Weight Monitoring: Weigh yourself every day. You should weigh yourself in the morning after you urinate and before you eat breakfast. Wear the same amount of clothing when you weigh yourself. Record your weight daily. Bring your recorded weights to your clinic visits. Tell your caregiver right away if you have gained 3 lb/1.4 kg or more in 1 day, 5 lb/2.3 kg in a week, or whatever amount you were told to report. SEEK IMMEDIATE MEDICAL CARE IF:   You are unable to keep fluids down.   You start to throw up (vomit) or diarrhea keeps coming back (persistent).   Abdominal pain develops, increases, or can be felt in one place (localizes).   You have an oral temperature above 102 F (38.9 C), not controlled by medicine.   Diarrhea contains blood or mucus.   You develop excessive weakness, dizziness, fainting, or extreme thirst.  MAKE SURE YOU:   Understand these instructions.   Will watch your condition.   Will get help right away if you are not doing well or get worse.  Document Released: 04/09/2003 Document Revised: 01/06/2011 Document Reviewed: 07/31/2008 [facility] Patient Information 2012 [place], [place].Nausea and Vomiting Nausea means you feel sick to your stomach. Throwing up (vomiting) is a reflex where stomach contents come out of your mouth. HOME CARE   Take medicine as told by your doctor.   Do not force yourself to eat. However, you do need to drink fluids.   If you feel like eating, eat a normal diet as told by your doctor.   Eat  rice, wheat, potatoes, bread, lean meats, yogurt, fruits, and vegetables.   Avoid high-fat foods.   Drink enough fluids to keep your pee (urine) clear or pale yellow.   Ask your doctor how to replace body fluid losses (rehydrate). Signs of body fluid loss (dehydration) include:   Feeling very thirsty.   Dry lips and mouth.   Feeling dizzy.   Dark pee.   Peeing less than normal.   Feeling confused.   Fast breathing or heart rate.  GET HELP RIGHT AWAY IF:   You have blood in your throw up.   You have black or bloody poop (stool).   You have a bad headache or stiff neck.   You feel confused.   You have bad belly (abdominal) pain.   You have chest pain or trouble breathing.   You do not pee at least once every 8 hours.   You have  cold, clammy skin.   You keep throwing up after 24 to 48 hours.   You have a fever.  MAKE SURE YOU:   Understand these instructions.   Will watch your condition.   Will get help right away if you are not doing well or get worse.  Document Released: 07/06/2007 Document Revised: 01/06/2011 Document Reviewed: 06/18/2010 [facility] Patient Information 2012 [place], [place].

## 2011-05-20 NOTE — ED Provider Notes (Signed)
History     CSN: [id number]  Arrival date & time 05/19/11  2222   First MD Initiated Contact with Patient 05/19/11 2335      Chief Complaint  Patient presents with  . Abdominal Pain  . n/v/d      Patient is a 51 y.o. female presenting with vomiting. The history is provided by the patient.  Emesis  This is a chronic problem. The current episode started more than 1 week ago. The problem occurs 5 to 10 times per day. The problem has been gradually worsening. There has been no fever. Associated symptoms include abdominal pain and diarrhea. Pertinent negatives include no chills, no cough and no fever.  Pt reports persistent crampy type lower abd pain that has been associated with N/V/D. States has been going on for several months, worse recently. Has been seen in ED on multiple occassions (most recently yesterday) for same symptoms and has a scheduled appointment with [facility] next Thursday. Reports 4 to 5 episodes of vomiting and approx 10 episodes of diarrhea over the last 24 hours. States has nothing at home for nausea or pain.  Past Medical History  Diagnosis Date  . Hypertension   . Bipolar 1 disorder   . Arthritis   . Migraine headache   . Acid reflux     History reviewed. No pertinent past surgical history.  Family History  Problem Relation Age of Onset  . Tuberculosis Mother   . Stroke Mother   . Tuberculosis Father   . Heart disease Father   . Bipolar disorder Sister   . Drug abuse Sister     History  Substance Use Topics  . Smoking status: Current Everyday Smoker  . Smokeless tobacco: Never Used  . Alcohol Use: Yes     occ    OB History    Grav Para Term Preterm Abortions TAB SAB Ect Mult Living                  Review of Systems  Constitutional: Negative.  Negative for fever and chills.  HENT: Negative.   Eyes: Negative.   Respiratory: Negative.  Negative for cough and shortness of breath.   Cardiovascular: Negative.  Negative for chest pain.    Gastrointestinal: Positive for nausea, vomiting, abdominal pain and diarrhea. Negative for blood in stool and abdominal distention.  Genitourinary: Negative.   Musculoskeletal: Negative.   Skin: Negative.   Neurological: Negative.   Hematological: Negative.   Psychiatric/Behavioral: Negative.     Allergies  Doxycycline; Flagyl; Penicillins; Shellfish allergy; and Sulfa antibiotics  Home Medications   Current Outpatient Rx  Name Route Sig Dispense Refill  . CLONIDINE HCL 0.1 MG PO TABS Oral Take 0.1 mg by mouth 3 (three) times daily.     Marland Kitchen LAMOTRIGINE 25 MG PO TABS Oral Take 25 mg by mouth daily.    Marland Kitchen LEVALBUTEROL TARTRATE 45 MCG/ACT IN AERO Inhalation Inhale 2 puffs into the lungs every 6 (six) hours as needed. For shortness of breath    . QUETIAPINE FUMARATE 200 MG PO TABS Oral Take 200 mg by mouth at bedtime.      BP 138/85  Pulse 90  Temp 98.7 F (37.1 C)  Resp 20  Wt 170 lb (77.111 kg)  SpO2 99%  LMP 05/15/2011  Physical Exam  Constitutional: She is oriented to person, place, and time. She appears well-developed and well-nourished.  HENT:  Head: Normocephalic and atraumatic.  Eyes: Conjunctivae are normal.  Cardiovascular: Normal rate and  regular rhythm.   Pulmonary/Chest: Effort normal and breath sounds normal.  Abdominal: Soft. Bowel sounds are normal.       No focal abd TTP  Musculoskeletal: Normal range of motion.  Neurological: She is alert and oriented to person, place, and time.  Skin: Skin is warm and dry.  Psychiatric: She has a normal mood and affect.    ED Course  Procedures  Pt reports pain improved and feeling better after medication fo nausea/pain and IVF's. There hve been no further episodes of vomiting or diarrhea and pt has tolerated sips of cl liquid. Will d/c home w/ medication for pain and nausea and encourage pt to keep her scheduled appoint w/ Dr Christella Hartigan w/ [facility] on 05/26/2011 as scheduled. Pt agreeable w/ plan. I have discussed pt w/ Dr  Norlene Campbell who is in agreement w/ plan.   Labs Reviewed - No data to display No results found.   No diagnosis found.    MDM  HPI/PE and clinical findings c/w 1. Chronic abd pain (PE unremarkble, several work-ups since Jan w/o acute findings, cbc w/ in last 24 hrs normal) 2. Vomiting and diarrhea (self reported, no episodes of either in ED, tolerating Po fluids prior to d/c, K+ normal)        Leanne Chang, NP 05/21/11 1200

## 2011-05-20 NOTE — ED Notes (Signed)
Pt reports having nausea, vomiting, and diarrhea since Tuesday. States that she has vomited 4 times in last 24 hrs and had one episode of diarrhea since yesterday. Pt reports history of IBS and was at the ER for same complaints last week. Husband at the bedside

## 2011-05-22 NOTE — ED Provider Notes (Signed)
Medical screening examination/treatment/procedure(s) were performed by non-physician practitioner and as supervising physician I was immediately available for consultation/collaboration.  Amaia Lavallie M Talayia Hjort, MD 05/22/11 2139 

## 2011-05-23 ENCOUNTER — Encounter (HOSPITAL_COMMUNITY): Payer: Self-pay | Admitting: *Deleted

## 2011-05-23 ENCOUNTER — Emergency Department (HOSPITAL_COMMUNITY)
Admission: EM | Admit: 2011-05-23 | Discharge: 2011-05-24 | Disposition: A | Discharge status: Home / Self Care | Payer: Self-pay | Attending: Emergency Medicine | Admitting: Emergency Medicine

## 2011-05-23 DIAGNOSIS — R197 Diarrhea, unspecified: Secondary | ICD-10-CM | POA: Insufficient documentation

## 2011-05-23 DIAGNOSIS — K921 Melena: Secondary | ICD-10-CM | POA: Insufficient documentation

## 2011-05-23 DIAGNOSIS — R112 Nausea with vomiting, unspecified: Secondary | ICD-10-CM | POA: Insufficient documentation

## 2011-05-23 DIAGNOSIS — R10819 Abdominal tenderness, unspecified site: Secondary | ICD-10-CM | POA: Insufficient documentation

## 2011-05-23 DIAGNOSIS — R109 Unspecified abdominal pain: Secondary | ICD-10-CM | POA: Insufficient documentation

## 2011-05-23 LAB — CBC
Hemoglobin: 11.5 g/dL — ABNORMAL LOW (ref 12.0–15.0)
MCH: 31 pg (ref 26.0–34.0)
Platelets: 253 10*3/uL (ref 150–400)
RBC: 3.71 MIL/uL — ABNORMAL LOW (ref 3.87–5.11)
WBC: 6 10*3/uL (ref 4.0–10.5)

## 2011-05-23 LAB — HEPATIC FUNCTION PANEL
ALT: 10 U/L (ref 0–35)
AST: 13 U/L (ref 0–37)
Bilirubin, Direct: <0.1 mg/dL (ref 0.0–0.3)
Total Protein: 6.1 g/dL (ref 6.0–8.3)

## 2011-05-23 LAB — BASIC METABOLIC PANEL
CO2: 28 mEq/L (ref 19–32)
Chloride: 105 mEq/L (ref 96–112)
Glucose, Bld: 94 mg/dL (ref 70–99)
Potassium: 4.1 mEq/L (ref 3.5–5.1)
Sodium: 141 mEq/L (ref 135–145)

## 2011-05-23 LAB — LIPASE, BLOOD: Lipase: 19 U/L (ref 11–59)

## 2011-05-23 MED ORDER — SODIUM CHLORIDE 0.9 % IV BOLUS (SEPSIS)
1000.0000 mL | Freq: Once | INTRAVENOUS | Status: AC
  Administered 2011-05-23: 1000 mL via INTRAVENOUS

## 2011-05-23 MED ORDER — HYDROMORPHONE HCL PF 1 MG/ML IJ SOLN
1.0000 mg | Freq: Once | INTRAMUSCULAR | Status: AC
  Administered 2011-05-23: 1 mg via INTRAVENOUS
  Filled 2011-05-23: qty 1

## 2011-05-23 MED ORDER — DIPHENHYDRAMINE HCL 50 MG/ML IJ SOLN
25.0000 mg | Freq: Once | INTRAMUSCULAR | Status: AC
  Administered 2011-05-23: 25 mg via INTRAVENOUS
  Filled 2011-05-23: qty 1

## 2011-05-23 MED ORDER — DROPERIDOL 2.5 MG/ML IJ SOLN
1.2500 mg | Freq: Once | INTRAMUSCULAR | Status: AC
  Administered 2011-05-23: 1.25 mg via INTRAVENOUS
  Filled 2011-05-23: qty 0.5

## 2011-05-23 MED ORDER — ONDANSETRON HCL 4 MG/2ML IJ SOLN
4.0000 mg | Freq: Once | INTRAMUSCULAR | Status: AC
  Administered 2011-05-23: 4 mg via INTRAVENOUS
  Filled 2011-05-23: qty 2

## 2011-05-23 NOTE — ED Provider Notes (Signed)
History     CSN: [id number]  Arrival date & time 05/23/11  2020   First MD Initiated Contact with Patient 05/23/11 2204      Chief Complaint  Patient presents with  . Abdominal Pain  . Diarrhea  . Rectal Bleeding    (Consider location/radiation/quality/duration/timing/severity/associated sxs/prior treatment) HPI Comments: Patient reports diffuse abdominal pain, nausea, vomiting, diarrhea that started this morning. This is similar to her previous episodes of this type of pain and vomiting. She's had multiple ED evaluations this past week without etiology for her pain. She is supposed to followup with [facility]. She denies any fever, chest pain, shortness of breath. She's had negative CT and HIDA scans in the past.  This is a similar pain and she's been having on and off for the past month. His multiple episodes of nonbloody nonbilious emesis and loose stools.  The history is provided by the patient.    Past Medical History  Diagnosis Date  . Hypertension   . Bipolar 1 disorder   . Arthritis   . Migraine headache   . Acid reflux     History reviewed. No pertinent past surgical history.  Family History  Problem Relation Age of Onset  . Tuberculosis Mother   . Stroke Mother   . Tuberculosis Father   . Heart disease Father   . Bipolar disorder Sister   . Drug abuse Sister     History  Substance Use Topics  . Smoking status: Current Everyday Smoker  . Smokeless tobacco: Never Used  . Alcohol Use: Yes     occ    OB History    Grav Para Term Preterm Abortions TAB SAB Ect Mult Living                  Review of Systems  Constitutional: Positive for activity change.  HENT: Negative for congestion and rhinorrhea.   Eyes: Negative for visual disturbance.  Respiratory: Negative for cough, chest tightness and shortness of breath.   Cardiovascular: Negative for chest pain.  Gastrointestinal: Positive for nausea, vomiting, abdominal pain, diarrhea, blood in stool and  hematochezia.  Genitourinary: Negative for dysuria, vaginal bleeding and vaginal discharge.  Musculoskeletal: Negative for back pain.  Skin: Negative for wound.  Neurological: Negative for headaches.    Allergies  Doxycycline; Flagyl; Penicillins; Shellfish allergy; and Sulfa antibiotics  Home Medications   Current Outpatient Rx  Name Route Sig Dispense Refill  . CLONIDINE HCL 0.1 MG PO TABS Oral Take 0.1 mg by mouth 3 (three) times daily.     Marland Kitchen FLUOXETINE HCL 20 MG PO CAPS Oral Take 20 mg by mouth 2 (two) times daily.    Marland Kitchen LAMOTRIGINE 25 MG PO TABS Oral Take 25 mg by mouth daily.    Marland Kitchen PROMETHAZINE HCL 25 MG RE SUPP Rectal Place 1 suppository (25 mg total) rectally every 6 (six) hours as needed for nausea. 12 each 0  . PROMETHAZINE HCL 25 MG PO TABS Oral Take 1 tablet (25 mg total) by mouth every 6 (six) hours as needed for nausea. 30 tablet 0    BP 94/58  Pulse 84  Temp(Src) 97.8 F (36.6 C) (Oral)  Resp 16  SpO2 95%  LMP 05/15/2011  Physical Exam  Constitutional: She is oriented to person, place, and time. She appears well-developed and well-nourished. No distress.  HENT:  Head: Normocephalic and atraumatic.  Mouth/Throat: Oropharynx is clear and moist. No oropharyngeal exudate.  Eyes: Conjunctivae and EOM are normal. Pupils are equal,  round, and reactive to light.  Neck: Normal range of motion. Neck supple.  Cardiovascular: Normal rate, regular rhythm and normal heart sounds.   Pulmonary/Chest: Effort normal and breath sounds normal. No respiratory distress. She exhibits no tenderness.  Abdominal: Soft. There is tenderness. There is no rebound and no guarding.       Diffuse abdominal tenderness palpation  Musculoskeletal: Normal range of motion. She exhibits no edema and no tenderness.  Neurological: She is alert and oriented to person, place, and time. No cranial nerve deficit.  Skin: Skin is warm.    ED Course  Procedures (including critical care time)  Labs  Reviewed  CBC - Abnormal; Notable for the following:    RBC 3.71 (*)    Hemoglobin 11.5 (*)    HCT 34.6 (*)    All other components within normal limits  URINALYSIS, ROUTINE W REFLEX MICROSCOPIC - Abnormal; Notable for the following:    Color, Urine STRAW (*)    All other components within normal limits  HEPATIC FUNCTION PANEL - Abnormal; Notable for the following:    Albumin 3.4 (*)    Total Bilirubin 0.2 (*)    All other components within normal limits  BASIC METABOLIC PANEL  LIPASE, BLOOD  LACTIC ACID, PLASMA   No results found.   1. Nausea and vomiting   2. Abdominal pain       MDM  Acute and chronic abdominal pain with nausea, vomiting and diarrhea.  CT scan from March 12 reviewed.  Hemoglobin stable. Blood electrolytes within normal limits. Patient states her pain and nausea has improved. No further vomiting in the ED. She is tolerating by mouth liquids and inability in the hallways  No clinical signs of dehydration. Tolerating by mouth. Stable for followup with gastroenterology  Glynn Octave, MD 05/24/11 [phone]

## 2011-05-23 NOTE — ED Notes (Signed)
FLUIDS OFFERED ( GINGER ALE ) TO PT. WILL TRY TO URINATE AFTER INTAKE.

## 2011-05-23 NOTE — ED Notes (Signed)
Pt REPORTS ABD pain started this  Am. Also has had dirrhea with blood in stool.

## 2011-05-23 NOTE — ED Notes (Signed)
Pt can not obtain urine at this time...she will let staff know when she can obtain urine.

## 2011-05-24 LAB — URINALYSIS, ROUTINE W REFLEX MICROSCOPIC
Glucose, UA: NEGATIVE mg/dL
Hgb urine dipstick: NEGATIVE
Leukocytes, UA: NEGATIVE
Protein, ur: NEGATIVE mg/dL
Specific Gravity, Urine: 1.012 (ref 1.005–1.030)

## 2011-05-24 MED ORDER — PROMETHAZINE HCL 25 MG RE SUPP: 25.0000 mg | Freq: Four times a day (QID) | RECTAL | Status: DC | PRN

## 2011-05-24 MED ORDER — PROMETHAZINE HCL 25 MG PO TABS: 25.0000 mg | ORAL_TABLET | Freq: Four times a day (QID) | ORAL | Status: DC | PRN

## 2011-05-24 NOTE — Discharge Instructions (Signed)
Nausea and Vomiting  Nausea is a sick feeling that often comes before throwing up (vomiting). Vomiting is a reflex where stomach contents come out of your mouth. Vomiting can cause severe loss of body fluids (dehydration). Children and elderly adults can become dehydrated quickly, especially if they also have diarrhea. Nausea and vomiting are symptoms of a condition or disease. It is important to find the cause of your symptoms.  CAUSES    Direct irritation of the stomach lining. This irritation can result from increased acid production (gastroesophageal reflux disease), infection, food poisoning, taking certain medicines (such as nonsteroidal anti-inflammatory drugs), alcohol use, or tobacco use.   Signals from the brain.These signals could be caused by a headache, heat exposure, an inner ear disturbance, increased pressure in the brain from injury, infection, a tumor, or a concussion, pain, emotional stimulus, or metabolic problems.   An obstruction in the gastrointestinal tract (bowel obstruction).   Illnesses such as diabetes, hepatitis, gallbladder problems, appendicitis, kidney problems, cancer, sepsis, atypical symptoms of a heart attack, or eating disorders.   Medical treatments such as chemotherapy and radiation.   Receiving medicine that makes you sleep (general anesthetic) during surgery.  DIAGNOSIS  Your caregiver may ask for tests to be done if the problems do not improve after a few days. Tests may also be done if symptoms are severe or if the reason for the nausea and vomiting is not clear. Tests may include:   Urine tests.   Blood tests.   Stool tests.   Cultures (to look for evidence of infection).   X-rays or other imaging studies.  Test results can help your caregiver make decisions about treatment or the need for additional tests.  TREATMENT  You need to stay well hydrated. Drink frequently but in small amounts.You may wish to drink water, sports drinks, clear broth, or eat frozen  ice pops or gelatin dessert to help stay hydrated.When you eat, eating slowly may help prevent nausea.There are also some antinausea medicines that may help prevent nausea.  HOME CARE INSTRUCTIONS    Take all medicine as directed by your caregiver.   If you do not have an appetite, do not force yourself to eat. However, you must continue to drink fluids.   If you have an appetite, eat a normal diet unless your caregiver tells you differently.   Eat a variety of complex carbohydrates (rice, wheat, potatoes, bread), lean meats, yogurt, fruits, and vegetables.   Avoid high-fat foods because they are more difficult to digest.   Drink enough water and fluids to keep your urine clear or pale yellow.   If you are dehydrated, ask your caregiver for specific rehydration instructions. Signs of dehydration may include:   Severe thirst.   Dry lips and mouth.   Dizziness.   Dark urine.   Decreasing urine frequency and amount.   Confusion.   Rapid breathing or pulse.  SEEK IMMEDIATE MEDICAL CARE IF:    You have blood or brown flecks (like coffee grounds) in your vomit.   You have black or bloody stools.   You have a severe headache or stiff neck.   You are confused.   You have severe abdominal pain.   You have chest pain or trouble breathing.   You do not urinate at least once every 8 hours.   You develop cold or clammy skin.   You continue to vomit for longer than 24 to 48 hours.   You have a fever.  MAKE SURE YOU:      Understand these instructions.   Will watch your condition.   Will get help right away if you are not doing well or get worse.  Document Released: 01/17/2005 Document Revised: 01/06/2011 Document Reviewed: 06/16/2010  [employer] Patient Information 2012 [employer].

## 2011-05-25 ENCOUNTER — Emergency Department (HOSPITAL_COMMUNITY)
Admission: EM | Admit: 2011-05-25 | Discharge: 2011-05-26 | Disposition: A | Discharge status: Home / Self Care | Payer: Self-pay | Attending: Emergency Medicine | Admitting: Emergency Medicine

## 2011-05-25 ENCOUNTER — Encounter (HOSPITAL_COMMUNITY): Payer: Self-pay | Admitting: Family Medicine

## 2011-05-25 DIAGNOSIS — Z79899 Other long term (current) drug therapy: Secondary | ICD-10-CM | POA: Insufficient documentation

## 2011-05-25 DIAGNOSIS — F319 Bipolar disorder, unspecified: Secondary | ICD-10-CM | POA: Insufficient documentation

## 2011-05-25 DIAGNOSIS — M549 Dorsalgia, unspecified: Secondary | ICD-10-CM | POA: Insufficient documentation

## 2011-05-25 DIAGNOSIS — I10 Essential (primary) hypertension: Secondary | ICD-10-CM | POA: Insufficient documentation

## 2011-05-25 DIAGNOSIS — R197 Diarrhea, unspecified: Secondary | ICD-10-CM | POA: Insufficient documentation

## 2011-05-25 DIAGNOSIS — R109 Unspecified abdominal pain: Secondary | ICD-10-CM | POA: Insufficient documentation

## 2011-05-25 DIAGNOSIS — K219 Gastro-esophageal reflux disease without esophagitis: Secondary | ICD-10-CM | POA: Insufficient documentation

## 2011-05-25 DIAGNOSIS — R112 Nausea with vomiting, unspecified: Secondary | ICD-10-CM | POA: Insufficient documentation

## 2011-05-25 LAB — URINALYSIS, ROUTINE W REFLEX MICROSCOPIC
Glucose, UA: NEGATIVE mg/dL
Hgb urine dipstick: NEGATIVE
Leukocytes, UA: NEGATIVE
Protein, ur: NEGATIVE mg/dL
Specific Gravity, Urine: 1.015 (ref 1.005–1.030)
Urobilinogen, UA: 0.2 mg/dL (ref 0.0–1.0)

## 2011-05-25 LAB — DIFFERENTIAL
Basophils Absolute: 0 10*3/uL (ref 0.0–0.1)
Lymphocytes Relative: 28 % (ref 12–46)
Lymphs Abs: 1.6 10*3/uL (ref 0.7–4.0)
Neutro Abs: 3.5 10*3/uL (ref 1.7–7.7)

## 2011-05-25 LAB — CBC
Platelets: 260 10*3/uL (ref 150–400)
RBC: 3.59 MIL/uL — ABNORMAL LOW (ref 3.87–5.11)
RDW: 12.8 % (ref 11.5–15.5)
WBC: 5.8 10*3/uL (ref 4.0–10.5)

## 2011-05-25 LAB — COMPREHENSIVE METABOLIC PANEL
ALT: 13 U/L (ref 0–35)
AST: 16 U/L (ref 0–37)
Alkaline Phosphatase: 68 U/L (ref 39–117)
CO2: 26 mEq/L (ref 19–32)
Chloride: 106 mEq/L (ref 96–112)
GFR calc non Af Amer: >90 mL/min (ref >90)
Glucose, Bld: 119 mg/dL — ABNORMAL HIGH (ref 70–99)
Potassium: 3.6 mEq/L (ref 3.5–5.1)
Sodium: 140 mEq/L (ref 135–145)
Total Bilirubin: 0.2 mg/dL — ABNORMAL LOW (ref 0.3–1.2)

## 2011-05-25 MED ORDER — SODIUM CHLORIDE 0.9 % IV BOLUS (SEPSIS)
1000.0000 mL | Freq: Once | INTRAVENOUS | Status: AC
  Administered 2011-05-25: 1000 mL via INTRAVENOUS

## 2011-05-25 MED ORDER — ONDANSETRON HCL 4 MG PO TABS: 4.0000 mg | ORAL_TABLET | Freq: Four times a day (QID) | ORAL | Status: DC

## 2011-05-25 MED ORDER — MORPHINE SULFATE 4 MG/ML IJ SOLN
4.0000 mg | Freq: Once | INTRAMUSCULAR | Status: AC
  Administered 2011-05-25: 4 mg via INTRAVENOUS
  Filled 2011-05-25: qty 1

## 2011-05-25 MED ORDER — ONDANSETRON HCL 4 MG/2ML IJ SOLN
4.0000 mg | Freq: Once | INTRAMUSCULAR | Status: AC
  Administered 2011-05-25: 4 mg via INTRAVENOUS
  Filled 2011-05-25: qty 2

## 2011-05-25 NOTE — ED Provider Notes (Signed)
History     CSN: [id number]  Arrival date & time 05/25/11  2047   First MD Initiated Contact with Patient 05/25/11 2204      Chief Complaint  Patient presents with  . Abdominal Pain  . Emesis    (Consider location/radiation/quality/duration/timing/severity/associated sxs/prior treatment) HPI Comments: Patient presents with lower abdominal pain that started this morning after she had eaten hot dogs. Also, associated nausea, vomiting, or diarrhea. Denies any blood in her stool or black stools. Denies any blood in her emesis. Nonbilious emesis. She's had the same flareups off and on for about the last 2-3 months. She's been seen in the emergency department for the same thing in the past. She denies any fevers. She says that the issues he starts after eating, but it's not related to any specific type of food. She did not have an outpatient physicians. Has not followed up with a primary care doctor or a gastroenterologist  The history is provided by the patient.    Past Medical History  Diagnosis Date  . Hypertension   . Bipolar 1 disorder   . Arthritis   . Migraine headache   . Acid reflux     History reviewed. No pertinent past surgical history.  Family History  Problem Relation Age of Onset  . Tuberculosis Mother   . Stroke Mother   . Tuberculosis Father   . Heart disease Father   . Bipolar disorder Sister   . Drug abuse Sister     History  Substance Use Topics  . Smoking status: Never Smoker   . Smokeless tobacco: Never Used  . Alcohol Use: Yes     occ    OB History    Grav Para Term Preterm Abortions TAB SAB Ect Mult Living                  Review of Systems  Constitutional: Negative for fever, chills, diaphoresis and fatigue.  HENT: Negative for congestion, rhinorrhea and sneezing.   Eyes: Negative.   Respiratory: Negative for cough, chest tightness and shortness of breath.   Cardiovascular: Negative for chest pain and leg swelling.  Gastrointestinal:  Positive for nausea, vomiting, abdominal pain and diarrhea. Negative for blood in stool.  Genitourinary: Negative for frequency, hematuria, flank pain and difficulty urinating.  Musculoskeletal: Positive for back pain. Negative for arthralgias.  Skin: Negative for rash.  Neurological: Negative for dizziness, speech difficulty, weakness, numbness and headaches.    Allergies  Doxycycline; Flagyl; Penicillins; Shellfish allergy; and Sulfa antibiotics  Home Medications   Current Outpatient Rx  Name Route Sig Dispense Refill  . ALBUTEROL SULFATE HFA 108 (90 BASE) MCG/ACT IN AERS Inhalation Inhale 2 puffs into the lungs every 6 (six) hours as needed. For shortness of breath.    . CLONIDINE HCL 0.1 MG PO TABS Oral Take 0.1 mg by mouth 3 (three) times daily.     Marland Kitchen FLUOXETINE HCL 20 MG PO CAPS Oral Take 20 mg by mouth 2 (two) times daily.    Marland Kitchen LAMOTRIGINE 25 MG PO TABS Oral Take 25 mg by mouth daily.    Marland Kitchen ONDANSETRON HCL 4 MG PO TABS Oral Take 1 tablet (4 mg total) by mouth every 6 (six) hours. 12 tablet 0    BP 129/77  Pulse 88  Temp(Src) 98.7 F (37.1 C) (Oral)  Resp 18  Wt 173 lb 4 oz (78.586 kg)  SpO2 91%  LMP 05/05/2011  Physical Exam  Constitutional: She is oriented to person, place, and  time. She appears well-developed and well-nourished.  HENT:  Head: Normocephalic and atraumatic.  Eyes: Pupils are equal, round, and reactive to light.  Neck: Normal range of motion. Neck supple.  Cardiovascular: Normal rate, regular rhythm and normal heart sounds.   Pulmonary/Chest: Effort normal and breath sounds normal. No respiratory distress. She has no wheezes. She has no rales. She exhibits no tenderness.  Abdominal: Soft. Bowel sounds are normal. There is tenderness. There is no rebound and no guarding.       Tenderness to palpation across the lower abdomen. No rebound or guarding is noted. No CVA tenderness  Musculoskeletal: Normal range of motion. She exhibits no edema.    Lymphadenopathy:    She has no cervical adenopathy.  Neurological: She is alert and oriented to person, place, and time.  Skin: Skin is warm and dry. No rash noted.  Psychiatric: She has a normal mood and affect.    ED Course  Procedures (including critical care time)  Results for orders placed during the hospital encounter of 05/25/11  URINALYSIS, ROUTINE W REFLEX MICROSCOPIC      Component Value Range   Color, Urine YELLOW  YELLOW    APPearance CLOUDY (*) CLEAR    Specific Gravity, Urine 1.015  1.005 - 1.030    pH 6.0  5.0 - 8.0    Glucose, UA NEGATIVE  NEGATIVE (mg/dL)   Hgb urine dipstick NEGATIVE  NEGATIVE    Bilirubin Urine NEGATIVE  NEGATIVE    Ketones, ur NEGATIVE  NEGATIVE (mg/dL)   Protein, ur NEGATIVE  NEGATIVE (mg/dL)   Urobilinogen, UA 0.2  0.0 - 1.0 (mg/dL)   Nitrite NEGATIVE  NEGATIVE    Leukocytes, UA NEGATIVE  NEGATIVE   CBC      Component Value Range   WBC 5.8  4.0 - 10.5 (K/uL)   RBC 3.59 (*) 3.87 - 5.11 (MIL/uL)   Hemoglobin 11.2 (*) 12.0 - 15.0 (g/dL)   HCT 16.1 (*) 09.6 - 46.0 (%)   MCV 93.9  78.0 - 100.0 (fL)   MCH 31.2  26.0 - 34.0 (pg)   MCHC 33.2  30.0 - 36.0 (g/dL)   RDW 04.5  40.9 - 81.1 (%)   Platelets 260  150 - 400 (K/uL)  DIFFERENTIAL      Component Value Range   Neutrophils Relative 60  43 - 77 (%)   Neutro Abs 3.5  1.7 - 7.7 (K/uL)   Lymphocytes Relative 28  12 - 46 (%)   Lymphs Abs 1.6  0.7 - 4.0 (K/uL)   Monocytes Relative 10  3 - 12 (%)   Monocytes Absolute 0.6  0.1 - 1.0 (K/uL)   Eosinophils Relative 2  0 - 5 (%)   Eosinophils Absolute 0.1  0.0 - 0.7 (K/uL)   Basophils Relative 0  0 - 1 (%)   Basophils Absolute 0.0  0.0 - 0.1 (K/uL)  COMPREHENSIVE METABOLIC PANEL      Component Value Range   Sodium 140  135 - 145 (mEq/L)   Potassium 3.6  3.5 - 5.1 (mEq/L)   Chloride 106  96 - 112 (mEq/L)   CO2 26  19 - 32 (mEq/L)   Glucose, Bld 119 (*) 70 - 99 (mg/dL)   BUN 16  6 - 23 (mg/dL)   Creatinine, Ser 9.14  0.50 - 1.10 (mg/dL)    Calcium 8.8  8.4 - 10.5 (mg/dL)   Total Protein 6.6  6.0 - 8.3 (g/dL)   Albumin 3.5  3.5 - 5.2 (  g/dL)   AST 16  0 - 37 (U/L)   ALT 13  0 - 35 (U/L)   Alkaline Phosphatase 68  39 - 117 (U/L)   Total Bilirubin 0.2 (*) 0.3 - 1.2 (mg/dL)   GFR calc non Af Amer >90  >90 (mL/min)   GFR calc Af Amer >90  >90 (mL/min)  LIPASE, BLOOD      Component Value Range   Lipase 20  11 - 59 (U/L)  PREGNANCY, URINE      Component Value Range   Preg Test, Ur NEGATIVE  NEGATIVE    No results found.   No results found.   1. Abdominal pain       MDM  Pt with benign appearing abdomen on exam.  Labs okay.  Reviewed chart and pt has been seen numerous times in past.  Has had recent CT abd/pelvis and HIDA scan which were both neg.  Per prior note, pt has appt to f/u tomorrow with [facility].  Will discuss importance of f/u with pt        Rolan Bucco, MD 05/25/11 2338

## 2011-05-25 NOTE — ED Notes (Signed)
Pt alert, c/o llq abd pain, described as sharp, radiates left to right, onset this am, pt c/o nausea with emesis, denies changes in bowel or bladder, skin pwd, resp even unlabored

## 2011-05-25 NOTE — Discharge Instructions (Signed)

## 2011-05-25 NOTE — ED Notes (Signed)
Patient states she has had abdominal pain, nausea and vomiting since this morning. States has vomited 5 times.

## 2011-05-27 ENCOUNTER — Emergency Department (HOSPITAL_COMMUNITY)
Admission: EM | Admit: 2011-05-27 | Discharge: 2011-05-28 | Disposition: A | Discharge status: Home / Self Care | Payer: Self-pay | Attending: Emergency Medicine | Admitting: Emergency Medicine

## 2011-05-27 ENCOUNTER — Encounter (HOSPITAL_COMMUNITY): Payer: Self-pay | Admitting: *Deleted

## 2011-05-27 DIAGNOSIS — F319 Bipolar disorder, unspecified: Secondary | ICD-10-CM | POA: Insufficient documentation

## 2011-05-27 DIAGNOSIS — M129 Arthropathy, unspecified: Secondary | ICD-10-CM | POA: Insufficient documentation

## 2011-05-27 DIAGNOSIS — R110 Nausea: Secondary | ICD-10-CM | POA: Insufficient documentation

## 2011-05-27 DIAGNOSIS — R197 Diarrhea, unspecified: Secondary | ICD-10-CM | POA: Insufficient documentation

## 2011-05-27 DIAGNOSIS — K219 Gastro-esophageal reflux disease without esophagitis: Secondary | ICD-10-CM | POA: Insufficient documentation

## 2011-05-27 DIAGNOSIS — Z79899 Other long term (current) drug therapy: Secondary | ICD-10-CM | POA: Insufficient documentation

## 2011-05-27 DIAGNOSIS — R109 Unspecified abdominal pain: Secondary | ICD-10-CM | POA: Insufficient documentation

## 2011-05-27 DIAGNOSIS — R10819 Abdominal tenderness, unspecified site: Secondary | ICD-10-CM | POA: Insufficient documentation

## 2011-05-27 DIAGNOSIS — I10 Essential (primary) hypertension: Secondary | ICD-10-CM | POA: Insufficient documentation

## 2011-05-27 LAB — URINALYSIS, ROUTINE W REFLEX MICROSCOPIC
Leukocytes, UA: NEGATIVE
Nitrite: NEGATIVE
Specific Gravity, Urine: 1.016 (ref 1.005–1.030)
pH: 6 (ref 5.0–8.0)

## 2011-05-27 LAB — DIFFERENTIAL
Basophils Absolute: 0 10*3/uL (ref 0.0–0.1)
Lymphocytes Relative: 19 % (ref 12–46)
Monocytes Absolute: 0.9 10*3/uL (ref 0.1–1.0)
Neutro Abs: 5.8 10*3/uL (ref 1.7–7.7)

## 2011-05-27 LAB — POCT I-STAT, CHEM 8
HCT: 32 % — ABNORMAL LOW (ref 36.0–46.0)
Hemoglobin: 10.9 g/dL — ABNORMAL LOW (ref 12.0–15.0)
Potassium: 4 mEq/L (ref 3.5–5.1)
Sodium: 139 mEq/L (ref 135–145)

## 2011-05-27 LAB — CBC
HCT: 32.2 % — ABNORMAL LOW (ref 36.0–46.0)
Platelets: 208 10*3/uL (ref 150–400)
RDW: 12.7 % (ref 11.5–15.5)
WBC: 8.4 10*3/uL (ref 4.0–10.5)

## 2011-05-27 LAB — POCT PREGNANCY, URINE: Preg Test, Ur: NEGATIVE

## 2011-05-27 MED ORDER — KETOROLAC TROMETHAMINE 30 MG/ML IJ SOLN
30.0000 mg | Freq: Once | INTRAMUSCULAR | Status: DC
  Filled 2011-05-27: qty 1

## 2011-05-27 MED ORDER — GI COCKTAIL ~~LOC~~
30.0000 mL | Freq: Once | ORAL | Status: AC
  Administered 2011-05-27: 30 mL via ORAL
  Filled 2011-05-27: qty 30

## 2011-05-27 MED ORDER — OMEPRAZOLE 20 MG PO CPDR: 20.0000 mg | DELAYED_RELEASE_CAPSULE | Freq: Every day | ORAL | Status: DC

## 2011-05-27 MED ORDER — FAMOTIDINE 20 MG PO TABS: 20.0000 mg | ORAL_TABLET | Freq: Two times a day (BID) | ORAL | Status: DC

## 2011-05-27 MED ORDER — HYDROCODONE-ACETAMINOPHEN 5-325 MG PO TABS
1.0000 | ORAL_TABLET | Freq: Once | ORAL | Status: AC
  Administered 2011-05-27: 1 via ORAL
  Filled 2011-05-27: qty 1

## 2011-05-27 NOTE — ED Notes (Signed)
Pt notified that MD is seeing pt who have been here by time.  Pt states she understands.

## 2011-05-27 NOTE — ED Notes (Signed)
Checked on pt.  She states pain continues to be 10/10.  No provider has seen pt yet.  PA Damen notified.

## 2011-05-27 NOTE — ED Notes (Signed)
Pt crying b/c her husband left the room.  She is anxious he has left her here because she says he is mad at her.  Family not found in waiting area.  Pt advocate notified and is looking for family member.

## 2011-05-27 NOTE — ED Provider Notes (Signed)
History     CSN: [id number]  Arrival date & time 05/27/11  4098   First MD Initiated Contact with Patient 05/27/11 2148      Chief Complaint  Patient presents with  . Abdominal Pain    HPI  History provided by the patient and partner. Patient is a 51 year old female with history of hypertension, acid reflux bipolar disorder who presents with continued lower abdominal pains. She has multiple visits to the emergency room this month for similar complaints. Patient was last seen 2 days ago with normal workup. Patient denies any significant change in symptoms. She complains of intermittent pressure and pains to lower abdomen. Pain does radiate today to the upper abdomen area. Symptoms occasionally seem worse with sitting upright. Patient also reports having increased hunger and appetite. Patient also mentions loose soft diarrhea type stools for the past 3 days. She reports having up to 3 episodes of bowel movements today. Patient reports similar abdominal pains last month as well. Patient's last menstrual cycle was 3 weeks ago. Patient does mention she was told she had ovarian cysts at one time. She denies any dysuria, hematuria, urinary frequency or flank pains. She denies any vaginal bleeding or vaginal discharge. Patient has not taken anything at home for her symptoms.    Past Medical History  Diagnosis Date  . Hypertension   . Bipolar 1 disorder   . Arthritis   . Migraine headache   . Acid reflux     History reviewed. No pertinent past surgical history.  Family History  Problem Relation Age of Onset  . Tuberculosis Mother   . Stroke Mother   . Tuberculosis Father   . Heart disease Father   . Bipolar disorder Sister   . Drug abuse Sister     History  Substance Use Topics  . Smoking status: Never Smoker   . Smokeless tobacco: Never Used  . Alcohol Use: Yes     occ    OB History    Grav Para Term Preterm Abortions TAB SAB Ect Mult Living                  Review of  Systems  Constitutional: Negative for fever, chills, diaphoresis and appetite change.  Respiratory: Negative for cough and shortness of breath.   Cardiovascular: Negative for chest pain.  Gastrointestinal: Positive for nausea, abdominal pain and diarrhea. Negative for vomiting and constipation.  Genitourinary: Negative for dysuria, frequency, hematuria, flank pain, vaginal bleeding and vaginal discharge.  Skin: Negative for rash.    Allergies  Doxycycline; Flagyl; Penicillins; Shellfish allergy; and Sulfa antibiotics  Home Medications   Current Outpatient Rx  Name Route Sig Dispense Refill  . ALBUTEROL SULFATE HFA 108 (90 BASE) MCG/ACT IN AERS Inhalation Inhale 2 puffs into the lungs every 6 (six) hours as needed. For shortness of breath.    . CLONIDINE HCL 0.1 MG PO TABS Oral Take 0.1 mg by mouth 3 (three) times daily.     Marland Kitchen FLUOXETINE HCL 20 MG PO CAPS Oral Take 20 mg by mouth 2 (two) times daily.    Marland Kitchen LAMOTRIGINE 25 MG PO TABS Oral Take 25 mg by mouth daily.    Marland Kitchen ONDANSETRON HCL 4 MG PO TABS Oral Take 4 mg by mouth every 6 (six) hours.      BP 154/135  Pulse 113  Temp(Src) 98.2 F (36.8 C) (Oral)  Resp 20  SpO2 97%  LMP 05/05/2011  Physical Exam  Nursing note and vitals reviewed. Constitutional:  She is oriented to person, place, and time. She appears well-developed and well-nourished. No distress.  HENT:  Head: Normocephalic and atraumatic.  Cardiovascular: Normal rate and regular rhythm.   Pulmonary/Chest: Effort normal and breath sounds normal. No respiratory distress. She has no wheezes. She has no rales.  Abdominal: Soft. She exhibits no distension and no mass. There is no rebound and no guarding.       Obese. Mild bilateral lower abdomen tenderness.  Neurological: She is alert and oriented to person, place, and time.  Skin: Skin is warm and dry. No rash noted.  Psychiatric: She has a normal mood and affect. Her behavior is normal.    ED Course   Procedures  Results for orders placed during the hospital encounter of 05/27/11  CBC      Component Value Range   WBC 8.4  4.0 - 10.5 (K/uL)   RBC 3.44 (*) 3.87 - 5.11 (MIL/uL)   Hemoglobin 10.6 (*) 12.0 - 15.0 (g/dL)   HCT 13.0 (*) 86.5 - 46.0 (%)   MCV 93.6  78.0 - 100.0 (fL)   MCH 30.8  26.0 - 34.0 (pg)   MCHC 32.9  30.0 - 36.0 (g/dL)   RDW 78.4  69.6 - 29.5 (%)   Platelets 208  150 - 400 (K/uL)  DIFFERENTIAL      Component Value Range   Neutrophils Relative 69  43 - 77 (%)   Neutro Abs 5.8  1.7 - 7.7 (K/uL)   Lymphocytes Relative 19  12 - 46 (%)   Lymphs Abs 1.6  0.7 - 4.0 (K/uL)   Monocytes Relative 10  3 - 12 (%)   Monocytes Absolute 0.9  0.1 - 1.0 (K/uL)   Eosinophils Relative 1  0 - 5 (%)   Eosinophils Absolute 0.1  0.0 - 0.7 (K/uL)   Basophils Relative 0  0 - 1 (%)   Basophils Absolute 0.0  0.0 - 0.1 (K/uL)  URINALYSIS, ROUTINE W REFLEX MICROSCOPIC      Component Value Range   Color, Urine YELLOW  YELLOW    APPearance CLEAR  CLEAR    Specific Gravity, Urine 1.016  1.005 - 1.030    pH 6.0  5.0 - 8.0    Glucose, UA NEGATIVE  NEGATIVE (mg/dL)   Hgb urine dipstick NEGATIVE  NEGATIVE    Bilirubin Urine NEGATIVE  NEGATIVE    Ketones, ur NEGATIVE  NEGATIVE (mg/dL)   Protein, ur NEGATIVE  NEGATIVE (mg/dL)   Urobilinogen, UA 0.2  0.0 - 1.0 (mg/dL)   Nitrite NEGATIVE  NEGATIVE    Leukocytes, UA NEGATIVE  NEGATIVE   POCT PREGNANCY, URINE      Component Value Range   Preg Test, Ur NEGATIVE  NEGATIVE   POCT I-STAT, CHEM 8      Component Value Range   Sodium 139  135 - 145 (mEq/L)   Potassium 4.0  3.5 - 5.1 (mEq/L)   Chloride 103  96 - 112 (mEq/L)   BUN 10  6 - 23 (mg/dL)   Creatinine, Ser 2.84  0.50 - 1.10 (mg/dL)   Glucose, Bld 132 (*) 70 - 99 (mg/dL)   Calcium, Ion 4.40  1.12 - 1.32 (mmol/L)   TCO2 27  0 - 100 (mmol/L)   Hemoglobin 10.9 (*) 12.0 - 15.0 (g/dL)   HCT 10.2 (*) 72.5 - 46.0 (%)      1. Abdominal pain       MDM  10:40 PM patient seen and  evaluated. Patient in  no acute distress. Patient with multiple recent visits to the emergency room this month for similar complaints. Patient seen 2 days ago with normal workup and laboratory results. Patient has no significant changes in symptoms. Patient has a benign appearing abdomen exam with no peritoneal signs.  Patient with unremarkable CT scan one month ago. Slight bilateral ovarian cysts were seen. No obstructing kidney stone also seen. Patient without clinical symptoms for kidney stone at this time. Patient without acute onset of severe pains concerning for ovarian torsion. Patient also had recent had a scan performed. Patient without any right upper quadrant pains concerning for cholecystitis or cholelithiasis.         Angus Seller, PA 05/28/11 0600

## 2011-05-27 NOTE — Discharge Instructions (Signed)
Your labs today have not shown any concerning signs for your symptoms. Please take medications as prescribed for the full length of time. Please followup with your doctors next week as planned.   Abdominal Pain Abdominal pain can be caused by many things. Your caregiver decides the seriousness of your pain by an examination and possibly blood tests and X-rays. Many cases can be observed and treated at home. Most abdominal pain is not caused by a disease and will probably improve without treatment. However, in many cases, more time must pass before a clear cause of the pain can be found. Before that point, it may not be known if you need more testing, or if hospitalization or surgery is needed. HOME CARE INSTRUCTIONS   Do not take laxatives unless directed by your caregiver.   Take pain medicine only as directed by your caregiver.   Only take over-the-counter or prescription medicines for pain, discomfort, or fever as directed by your caregiver.   Try a clear liquid diet (broth, tea, or water) for as long as directed by your caregiver. Slowly move to a bland diet as tolerated.  SEEK IMMEDIATE MEDICAL CARE IF:   The pain does not go away.   You have a fever.   You keep throwing up (vomiting).   The pain is felt only in portions of the abdomen. Pain in the right side could possibly be appendicitis. In an adult, pain in the left lower portion of the abdomen could be colitis or diverticulitis.   You pass bloody or black tarry stools.  MAKE SURE YOU:   Understand these instructions.   Will watch your condition.   Will get help right away if you are not doing well or get worse.  Document Released: 10/27/2004 Document Revised: 01/06/2011 Document Reviewed: 09/05/2007 [facility] Patient Information 2012 [place], [place].   RESOURCE GUIDE  Dental Problems  Patients with Medicaid: [facility] [phone] [address]                                            [phone] [address] [employer] Phone:  [phone]                                                  Phone:  [phone]  If unable to pay or uninsured, contact:  [facility] or [facility]. to become qualified for the adult dental clinic.  Chronic Pain Problems Contact [facility]  [phone] Patients need to be referred by their primary care doctor.  Insufficient Money for Medicine Contact United Way:  call "211" or [facility] [phone].  No Primary Care Doctor Call [facility]  [phone] Other agencies that provide inexpensive medical care    [facility]  [phone]    [facility] Internal Medicine  [phone]    [facility]  [phone]    [facility]  [phone]    [facility]  [phone]    [facility]  [phone]  Psychological Services [facility] Behavioral Health  [phone] [place] Services  [phone] [facility] Mental Health  [phone] (emergency services [phone])  Substance Abuse Resources [facility] Drug Services  [phone] [facility] [phone] [facility] [place] [phone] [facility] [phone] Residential & Outpatient Substance Abuse Program  [phone]  Abuse/Neglect [facility] Child Abuse Hotline 603-317-7594 [facility] Child Abuse Hotline 213-120-5619 (After Hours)  Emergency Shelter [facility] [phone]  Maternity Homes Room at the [place] [facility] [place] [phone] [place] [facility] Services [phone]  MRSA Hotline #:   (563)523-5998    [facility] Resources  [facility] [place]     United Way                          [facility] Dept. [address] [place]                       [address]      [address]  Blondell Reveal Phone:  [phone]                                    Phone:  [phone]                 Phone:  [phone]  [facility] Mental Health Phone:  [phone]  [facility] Child Abuse Hotline [phone] [phone] (After Hours)

## 2011-05-27 NOTE — ED Notes (Signed)
Pt has been suffering from epigastric pain (upper R and L quadrant pain).  Pt has been seen both here and at [facility] in the last few weeks for abd pain.  Pt states that tonight it began acutely after eating a greasy meal.

## 2011-05-27 NOTE — ED Notes (Signed)
Pt states pain reduced to 8/10 after gi cocktail.

## 2011-05-27 NOTE — ED Notes (Signed)
Boyfriend came back to room.  It was requested of him to let staff know if he left again.   Pt is calm.

## 2011-05-28 ENCOUNTER — Emergency Department (HOSPITAL_COMMUNITY): Payer: Self-pay

## 2011-05-28 ENCOUNTER — Emergency Department (HOSPITAL_COMMUNITY)
Admission: EM | Admit: 2011-05-28 | Discharge: 2011-05-29 | Disposition: A | Discharge status: Home / Self Care | Payer: Self-pay | Attending: Emergency Medicine | Admitting: Emergency Medicine

## 2011-05-28 ENCOUNTER — Encounter (HOSPITAL_COMMUNITY): Payer: Self-pay

## 2011-05-28 ENCOUNTER — Other Ambulatory Visit (HOSPITAL_COMMUNITY): Payer: Self-pay | Admitting: Pharmacy Technician

## 2011-05-28 DIAGNOSIS — R1110 Vomiting, unspecified: Secondary | ICD-10-CM | POA: Insufficient documentation

## 2011-05-28 DIAGNOSIS — R109 Unspecified abdominal pain: Secondary | ICD-10-CM | POA: Insufficient documentation

## 2011-05-28 DIAGNOSIS — F319 Bipolar disorder, unspecified: Secondary | ICD-10-CM | POA: Insufficient documentation

## 2011-05-28 DIAGNOSIS — I10 Essential (primary) hypertension: Secondary | ICD-10-CM | POA: Insufficient documentation

## 2011-05-28 DIAGNOSIS — Z8739 Personal history of other diseases of the musculoskeletal system and connective tissue: Secondary | ICD-10-CM | POA: Insufficient documentation

## 2011-05-28 LAB — WET PREP, GENITAL

## 2011-05-28 LAB — URINALYSIS, ROUTINE W REFLEX MICROSCOPIC
Nitrite: NEGATIVE
Specific Gravity, Urine: 1.01 (ref 1.005–1.030)
Urobilinogen, UA: 0.2 mg/dL (ref 0.0–1.0)

## 2011-05-28 MED ORDER — OXYCODONE-ACETAMINOPHEN 5-325 MG PO TABS: 2.0000 | ORAL_TABLET | Freq: Once | ORAL | Status: DC

## 2011-05-28 MED ORDER — NALBUPHINE HCL 10 MG/ML IJ SOLN: 10.0000 mg | Freq: Once | INTRAMUSCULAR | Status: DC

## 2011-05-28 MED ORDER — FENTANYL CITRATE 0.05 MG/ML IJ SOLN
50.0000 ug | Freq: Once | INTRAMUSCULAR | Status: AC
  Administered 2011-05-28: 50 ug via INTRAVENOUS
  Filled 2011-05-28: qty 2

## 2011-05-28 MED ORDER — KETOROLAC TROMETHAMINE 30 MG/ML IJ SOLN: 30.0000 mg | Freq: Once | INTRAMUSCULAR | Status: DC

## 2011-05-28 NOTE — ED Notes (Signed)
Pt reports  Seen here on 05/27/11 for presenting complaint- Pt was unable to purchasee pain meds states unable to afford.  Pt has no new symptoms since 4/26

## 2011-05-28 NOTE — ED Notes (Signed)
Pt c/o of generalized abdominal pain and vomiting that started this am.  Denies urinary problems- Knee pain chronic no new injury- family report increased pain with her legs with walking

## 2011-05-28 NOTE — ED Provider Notes (Signed)
History     CSN: [id number]  Arrival date & time 05/28/11  1627   First MD Initiated Contact with Patient 05/28/11 2009      Chief Complaint  Patient presents with  . Abdominal Pain  . Emesis  . Knee Pain    HPI  History provided by the patient and partner. Patient is a 51 year old female with history of hypertension, bipolar disorder migraine headaches who returns for reevaluation of lower abdominal pains. Patient was seen by me last evening at [facility]. Today patient presents to [facility] long for continued lower abdominal pains. Yesterday patient complained pain radiating to the epigastric area and stomach. Today pain is primarily only in the lower abdomen and suprapubic area. Patient states she was unable to afford pain medications prescribed and has not taken anything for pain. She denies any aggravating or alleviating factors. Patient has associated nausea/vomiting throughout the day. She denies any fever, chills, sweats. Patient denies any dysuria, hematuria, urinary frequency. She denies any vaginal bleeding or vaginal discharge. Patient does have a recent CT scan from one month ago showing small ovarian cyst.     Past Medical History  Diagnosis Date  . Hypertension   . Bipolar 1 disorder   . Arthritis   . Migraine headache   . Acid reflux     History reviewed. No pertinent past surgical history.  Family History  Problem Relation Age of Onset  . Tuberculosis Mother   . Stroke Mother   . Tuberculosis Father   . Heart disease Father   . Bipolar disorder Sister   . Drug abuse Sister     History  Substance Use Topics  . Smoking status: Never Smoker   . Smokeless tobacco: Never Used  . Alcohol Use: Yes     occ    OB History    Grav Para Term Preterm Abortions TAB SAB Ect Mult Living                  Review of Systems  Constitutional: Positive for appetite change. Negative for fever and chills.  Gastrointestinal: Positive for nausea, vomiting and  abdominal pain. Negative for diarrhea and constipation.  Genitourinary: Negative for dysuria, frequency, hematuria, flank pain, vaginal bleeding and vaginal discharge.    Allergies  Doxycycline; Flagyl; Penicillins; Shellfish allergy; and Sulfa antibiotics  Home Medications   Current Outpatient Rx  Name Route Sig Dispense Refill  . ALBUTEROL SULFATE HFA 108 (90 BASE) MCG/ACT IN AERS Inhalation Inhale 2 puffs into the lungs every 6 (six) hours as needed. For shortness of breath.    . CLONIDINE HCL 0.1 MG PO TABS Oral Take 0.1 mg by mouth 3 (three) times daily.     Marland Kitchen FLUOXETINE HCL 20 MG PO CAPS Oral Take 20 mg by mouth 2 (two) times daily.    Marland Kitchen LAMOTRIGINE 25 MG PO TABS Oral Take 25 mg by mouth daily.    Marland Kitchen ONDANSETRON HCL 4 MG PO TABS Oral Take 4 mg by mouth every 6 (six) hours.      BP 135/71  Pulse 68  Temp(Src) 97.4 F (36.3 C) (Oral)  Resp 20  Ht 5\' 3"  (1.6 m)  Wt 173 lb (78.472 kg)  BMI 30.65 kg/m2  SpO2 98%  LMP 05/05/2011  Physical Exam  Nursing note and vitals reviewed. Constitutional: She is oriented to person, place, and time. She appears well-developed and well-nourished. No distress.  HENT:  Head: Normocephalic and atraumatic.  Cardiovascular: Normal rate and regular rhythm.  Pulmonary/Chest: Effort normal and breath sounds normal.  Abdominal: Soft. She exhibits no distension. There is no rebound and no guarding.       Diffuse lower abdominal tenderness. No peritoneal signs.  Genitourinary: Cervix exhibits no motion tenderness.       Chaperone was present. Mild bilateral adnexal tenderness. No significant masses appreciated. Cervix mildly erythematous. No friability mild discharge.  Neurological: She is alert and oriented to person, place, and time.  Skin: Skin is warm and dry. No rash noted.  Psychiatric: She has a normal mood and affect. Her behavior is normal.    ED Course  Procedures   Results for orders placed during the hospital encounter of 05/28/11    URINALYSIS, ROUTINE W REFLEX MICROSCOPIC      Component Value Range   Color, Urine YELLOW  YELLOW    APPearance CLEAR  CLEAR    Specific Gravity, Urine 1.010  1.005 - 1.030    pH 7.5  5.0 - 8.0    Glucose, UA NEGATIVE  NEGATIVE (mg/dL)   Hgb urine dipstick NEGATIVE  NEGATIVE    Bilirubin Urine NEGATIVE  NEGATIVE    Ketones, ur NEGATIVE  NEGATIVE (mg/dL)   Protein, ur NEGATIVE  NEGATIVE (mg/dL)   Urobilinogen, UA 0.2  0.0 - 1.0 (mg/dL)   Nitrite NEGATIVE  NEGATIVE    Leukocytes, UA NEGATIVE  NEGATIVE   WET PREP, GENITAL      Component Value Range   Yeast Wet Prep HPF POC NONE SEEN  NONE SEEN    Trich, Wet Prep NONE SEEN  NONE SEEN    Clue Cells Wet Prep HPF POC NONE SEEN  NONE SEEN    WBC, Wet Prep HPF POC FEW (*) NONE SEEN       US Transvaginal Non-ob  05/28/2011  *RADIOLOGY REPORT*  Clinical Data:  Lower quadrant pain.  Rule out torsion.  TRANSABDOMINAL AND TRANSVAGINAL ULTRASOUND OF PELVIS DOPPLER ULTRASOUND OF OVARIES  Technique:  Both transabdominal and transvaginal ultrasound examinations of the pelvis were performed. Transabdominal technique was performed for global imaging of the pelvis including uterus, ovaries, adnexal regions, and pelvic cul-de-sac.  It was necessary to proceed with endovaginal exam following the transabdominal exam to visualize the ovaries.  Color and duplex Doppler ultrasound was utilized to evaluate blood flow to the ovaries.  Comparison:  None.  Findings:  Uterus:  6.7 x 3.7 x 3.7 cm.  No myometrial masses.  Endometrium:  Uniform.  5 mm in thickness.  Right ovary: Normal appearance/no adnexal mass  Left ovary:   Normal appearance/no adnexal mass  Pulsed Doppler evaluation demonstrates normal low-resistance arterial and venous waveforms in both ovaries.  IMPRESSION: Normal exam.  No evidence of pelvic mass or other significant abnormality.  No sonographic evidence for ovarian torsion.  Original Report Authenticated By: Donavan Burnet, M.D.   US Pelvis  Complete  05/28/2011  *RADIOLOGY REPORT*  Clinical Data:  Lower quadrant pain.  Rule out torsion.  TRANSABDOMINAL AND TRANSVAGINAL ULTRASOUND OF PELVIS DOPPLER ULTRASOUND OF OVARIES  Technique:  Both transabdominal and transvaginal ultrasound examinations of the pelvis were performed. Transabdominal technique was performed for global imaging of the pelvis including uterus, ovaries, adnexal regions, and pelvic cul-de-sac.  It was necessary to proceed with endovaginal exam following the transabdominal exam to visualize the ovaries.  Color and duplex Doppler ultrasound was utilized to evaluate blood flow to the ovaries.  Comparison:  None.  Findings:  Uterus:  6.7 x 3.7 x 3.7 cm.  No myometrial  masses.  Endometrium:  Uniform.  5 mm in thickness.  Right ovary: Normal appearance/no adnexal mass  Left ovary:   Normal appearance/no adnexal mass  Pulsed Doppler evaluation demonstrates normal low-resistance arterial and venous waveforms in both ovaries.  IMPRESSION: Normal exam.  No evidence of pelvic mass or other significant abnormality.  No sonographic evidence for ovarian torsion.  Original Report Authenticated By: Donavan Burnet, M.D.   [place] Art/ven Flow Abd Pelv Doppler  05/28/2011  *RADIOLOGY REPORT*  Clinical Data:  Lower quadrant pain.  Rule out torsion.  TRANSABDOMINAL AND TRANSVAGINAL ULTRASOUND OF PELVIS DOPPLER ULTRASOUND OF OVARIES  Technique:  Both transabdominal and transvaginal ultrasound examinations of the pelvis were performed. Transabdominal technique was performed for global imaging of the pelvis including uterus, ovaries, adnexal regions, and pelvic cul-de-sac.  It was necessary to proceed with endovaginal exam following the transabdominal exam to visualize the ovaries.  Color and duplex Doppler ultrasound was utilized to evaluate blood flow to the ovaries.  Comparison:  None.  Findings:  Uterus:  6.7 x 3.7 x 3.7 cm.  No myometrial masses.  Endometrium:  Uniform.  5 mm in thickness.  Right ovary:  Normal appearance/no adnexal mass  Left ovary:   Normal appearance/no adnexal mass  Pulsed Doppler evaluation demonstrates normal low-resistance arterial and venous waveforms in both ovaries.  IMPRESSION: Normal exam.  No evidence of pelvic mass or other significant abnormality.  No sonographic evidence for ovarian torsion.  Original Report Authenticated By: Donavan Burnet, M.D.     1. Abdominal pain       MDM  9:00 PM patient seen and evaluated. Patient no acute distress.  Patient had a recent CT scan of abdomen one month ago. This did show ovarian cyst. Patient did not have acute or sudden chest pains. Plan to perform pelvic exam and consider ultrasound study.  Patient without vomiting in the ED. Patient with multiple evaluations for same complaint. Ultrasound today did not show any significant ovarian cysts or torsion. At this time we'll discharged to PCP followup.      Angus Seller, [place] 05/29/11 [id number]

## 2011-05-28 NOTE — ED Notes (Signed)
Pt in from home with lower abd pain radiating to the back states onset yesterday c/o n/v denies diarrhea pt is a/o x3 states pain comes in waves

## 2011-05-28 NOTE — ED Notes (Signed)
Pt ambulated to restroom to empty bladder. Pelvic examine setup.

## 2011-05-29 MED ORDER — FENTANYL CITRATE 0.05 MG/ML IJ SOLN
50.0000 ug | Freq: Once | INTRAMUSCULAR | Status: AC
  Administered 2011-05-29: 50 ug via INTRAVENOUS
  Filled 2011-05-29: qty 2

## 2011-05-29 NOTE — ED Provider Notes (Signed)
Medical screening examination/treatment/procedure(s) were performed by non-physician practitioner and as supervising physician I was immediately available for consultation/collaboration.  Loetta Connelley, MD 05/29/11 2157 

## 2011-05-29 NOTE — Discharge Instructions (Signed)
Your tests and ultrasound have not shown any signs for concerning cause of your symptoms. Please followup with your primary care provider and specialists as planned.   Abdominal Pain Abdominal pain can be caused by many things. Your caregiver decides the seriousness of your pain by an examination and possibly blood tests and X-rays. Many cases can be observed and treated at home. Most abdominal pain is not caused by a disease and will probably improve without treatment. However, in many cases, more time must pass before a clear cause of the pain can be found. Before that point, it may not be known if you need more testing, or if hospitalization or surgery is needed. HOME CARE INSTRUCTIONS   Do not take laxatives unless directed by your caregiver.   Take pain medicine only as directed by your caregiver.   Only take over-the-counter or prescription medicines for pain, discomfort, or fever as directed by your caregiver.   Try a clear liquid diet (broth, tea, or water) for as long as directed by your caregiver. Slowly move to a bland diet as tolerated.  SEEK IMMEDIATE MEDICAL CARE IF:   The pain does not go away.   You have a fever.   You keep throwing up (vomiting).   The pain is felt only in portions of the abdomen. Pain in the right side could possibly be appendicitis. In an adult, pain in the left lower portion of the abdomen could be colitis or diverticulitis.   You pass bloody or black tarry stools.  MAKE SURE YOU:   Understand these instructions.   Will watch your condition.   Will get help right away if you are not doing well or get worse.  Document Released: 10/27/2004 Document Revised: 01/06/2011 Document Reviewed: 09/05/2007 [facility] Patient Information 2012 [place], [place].   RESOURCE GUIDE  Dental Problems  Patients with Medicaid: [facility] [phone] [address]                                           [phone] [address]  [employer] Phone:  [phone]                                                  Phone:  [phone]  If unable to pay or uninsured, contact:  [facility] or [facility]. to become qualified for the adult dental clinic.  Chronic Pain Problems Contact [facility]  [phone] Patients need to be referred by their primary care doctor.  Insufficient Money for Medicine Contact United Way:  call "211" or [facility] [phone].  No Primary Care Doctor Call [facility]  [phone] Other agencies that provide inexpensive medical care    [facility]  [phone]    [facility] Internal Medicine  [phone]    [facility]  [phone]    [facility]  [phone]    [facility]  [phone]    [facility] Child Clinic  [phone]  Psychological Services [facility] Behavioral Health  [phone] [facility] Services  [phone] [facility]   [phone] (emergency services [phone])  Substance Abuse Resources Alcohol and Drug Services  [phone] [facility] [phone] The [place] [phone] Chinita Pester [phone] Residential & Outpatient Substance Abuse Program  [phone]  Abuse/Neglect [place] [phone] Center [phone] (After Hours)  Emergency [place] [phone]  [place] [facility] [place] [phone] [place] [phone]  MRSA Hotline #:   508-415-4719    [facility] Dept. [address]      [place] Phone:  [phone]                                   Phone:  [phone]                  Phone:  [place] Phone:  [place] [phone] (After Hours)

## 2011-05-30 LAB — GC/CHLAMYDIA PROBE AMP, GENITAL
Chlamydia, DNA Probe: NEGATIVE
GC Probe Amp, Genital: NEGATIVE

## 2011-05-30 NOTE — ED Provider Notes (Signed)
Medical screening examination/treatment/procedure(s) were performed by non-physician practitioner and as supervising physician I was immediately available for consultation/collaboration.  Tykeria Wawrzyniak, MD 05/30/11 1556 

## 2011-05-31 ENCOUNTER — Emergency Department (HOSPITAL_COMMUNITY): Payer: Self-pay

## 2011-05-31 ENCOUNTER — Emergency Department (HOSPITAL_COMMUNITY)
Admission: EM | Admit: 2011-05-31 | Discharge: 2011-06-01 | Disposition: A | Discharge status: Home / Self Care | Payer: Self-pay | Attending: Emergency Medicine | Admitting: Emergency Medicine

## 2011-05-31 ENCOUNTER — Encounter (HOSPITAL_COMMUNITY): Payer: Self-pay | Admitting: Family Medicine

## 2011-05-31 DIAGNOSIS — M25562 Pain in left knee: Secondary | ICD-10-CM

## 2011-05-31 DIAGNOSIS — I10 Essential (primary) hypertension: Secondary | ICD-10-CM | POA: Insufficient documentation

## 2011-05-31 DIAGNOSIS — M25569 Pain in unspecified knee: Secondary | ICD-10-CM | POA: Insufficient documentation

## 2011-05-31 DIAGNOSIS — W010XXA Fall on same level from slipping, tripping and stumbling without subsequent striking against object, initial encounter: Secondary | ICD-10-CM | POA: Insufficient documentation

## 2011-05-31 DIAGNOSIS — R269 Unspecified abnormalities of gait and mobility: Secondary | ICD-10-CM | POA: Insufficient documentation

## 2011-05-31 DIAGNOSIS — R296 Repeated falls: Secondary | ICD-10-CM

## 2011-05-31 NOTE — ED Provider Notes (Signed)
History     CSN: [id number]  Arrival date & time 05/31/11  2008   First MD Initiated Contact with Patient 05/31/11 2229      Chief Complaint  Patient presents with  . Fall  . Knee Pain    (Consider location/radiation/quality/duration/timing/severity/associated sxs/prior treatment) HPI  51 year old female with history of bipolar, history of arthritis presents with chief complaints of fall. Patient states she was walking down the steps today when her left knee gave out causing her to fall forward down 3 steps. Onset was acute, and pain is intermittent. She denies hitting her head or loss of consciousness. She does complain of pain to the left knee, worsening with walking and improved with rest. She also complaining of pain to the low back from the fall. Describe pain is sharp and throbbing sensation, worsened with movement. She denies any other injuries. 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 She is out of her pain medication.  Past Medical History  Diagnosis Date  . Hypertension   . Bipolar 1 disorder   . Arthritis   . Migraine headache   . Acid reflux     History reviewed. No pertinent past surgical history.  Family History  Problem Relation Age of Onset  . Tuberculosis Mother   . Stroke Mother   . Tuberculosis Father   . Heart disease Father   . Bipolar disorder Sister   . Drug abuse Sister     History  Substance Use Topics  . Smoking status: Never Smoker   . Smokeless tobacco: Never Used  . Alcohol Use: Yes     occ    OB History    Grav Para Term Preterm Abortions TAB SAB Ect Mult Living                  Review of Systems  All other systems reviewed and are negative.    Allergies  Doxycycline; Flagyl; Penicillins; Shellfish allergy; and Sulfa antibiotics  Home Medications   Current Outpatient Rx  Name Route Sig Dispense Refill  . ALBUTEROL SULFATE HFA 108 (90 BASE) MCG/ACT IN AERS Inhalation Inhale 2 puffs  into the lungs every 6 (six) hours as needed. For shortness of breath.    . CLONIDINE HCL 0.1 MG PO TABS Oral Take 0.1 mg by mouth 3 (three) times daily.     Marland Kitchen FLUOXETINE HCL 20 MG PO CAPS Oral Take 20 mg by mouth 2 (two) times daily.    Marland Kitchen HYDROCODONE-ACETAMINOPHEN 5-500 MG PO TABS Oral Take 1 tablet by mouth every 6 (six) hours as needed. For pain.    Marland Kitchen LAMOTRIGINE 25 MG PO TABS Oral Take 25 mg by mouth daily.    Marland Kitchen ONDANSETRON HCL 4 MG PO TABS Oral Take 4 mg by mouth every 6 (six) hours as needed. For nausea.      BP 103/53  Pulse 71  Temp(Src) 98.1 F (36.7 C) (Oral)  Resp 18  SpO2 98%  LMP 05/05/2011  Physical Exam  Nursing note and vitals reviewed. Constitutional: She appears well-developed and well-nourished. No distress.  HENT:  Head: Normocephalic and atraumatic.  Eyes: Conjunctivae are normal.  Neck: Neck supple.  Pulmonary/Chest: Effort normal. She exhibits no tenderness.  Abdominal: Soft. There is no tenderness.  Musculoskeletal:       L knee: tenderness to medial aspect of knee.  Increasing pain with palpation, FROM, no deformity or overlying skin changes. Normal L hip and L ankle on  evaluation.    Mild tenderness to L paravertebral region in lower back, FROM, no step off, no deformity noted.    Pt walks with an antalgic gait favoring R side.    Neurological: She is alert.  Skin: Skin is warm.  Psychiatric: She has a normal mood and affect.    ED Course  Procedures (including critical care time)  Labs Reviewed - No data to display No results found.   No diagnosis found.  Dg Knee Complete 4 Views Left  05/31/2011  *RADIOLOGY REPORT*  Clinical Data: Post fall, now with left anterior knee pain  LEFT KNEE - COMPLETE 4+ VIEW  Comparison: 02/17/2011  Findings: No fracture or dislocation.  Redemonstrated moderate to severe tricompartmental degenerative change, worse with medial compartment with joint space loss, subchondral sclerosis and osteophytosis.  There is a  small suprapatellar joint effusion. Regional soft tissues are normal.  IMPRESSION:  1. Small joint effusion without fracture.  2.  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  Original Report Authenticated By: Waynard Reeds, M.D.      MDM  L knee pain s/p fall.  Xray shows no acute fx or dislocation.  Knee sleeve apply for comfort.  Low back pain were unremarkable on exam, FROM, sensation intact, no step off, no deformity.  Ibuprofen given for pain.  Pt voice understanding and agrees with plan.        Fayrene Helper, PA-C 06/01/11 0002

## 2011-05-31 NOTE — ED Notes (Signed)
Patient states she was going down a flight of stairs and her left knee "gave out" causing her to fall. C/o lower back pain.

## 2011-06-01 MED ORDER — IBUPROFEN 800 MG PO TABS: 800.0000 mg | ORAL_TABLET | Freq: Three times a day (TID) | ORAL | Status: DC

## 2011-06-01 NOTE — Discharge Instructions (Signed)
Knee Pain The knee is the complex joint between your thigh and your lower leg. It is made up of bones, tendons, ligaments, and cartilage. The bones that make up the knee are:  The femur in the thigh.   The tibia and fibula in the lower leg.   The patella or kneecap riding in the groove on the lower femur.  CAUSES  Knee pain is a common complaint with many causes. A few of these causes are:  Injury, such as:   A ruptured ligament or tendon injury.   Torn cartilage.   Medical conditions, such as:   Gout   Arthritis   Infections   Overuse, over training or overdoing a physical activity.  Knee pain can be minor or severe. Knee pain can accompany debilitating injury. Minor knee problems often respond well to self-care measures or get well on their own. More serious injuries may need medical intervention or even surgery. SYMPTOMS The knee is complex. Symptoms of knee problems can vary widely. Some of the problems are:  Pain with movement and weight bearing.   Swelling and tenderness.   Buckling of the knee.   Inability to straighten or extend your knee.   Your knee locks and you cannot straighten it.   Warmth and redness with pain and fever.   Deformity or dislocation of the kneecap.  DIAGNOSIS  Determining what is wrong may be very straight forward such as when there is an injury. It can also be challenging because of the complexity of the knee. Tests to make a diagnosis may include:  Your caregiver taking a history and doing a physical exam.   Routine X-rays can be used to rule out other problems. X-rays will not reveal a cartilage tear. Some injuries of the knee can be diagnosed by:   Arthroscopy a surgical technique by which a small video camera is inserted through tiny incisions on the sides of the knee. This procedure is used to examine and repair internal knee joint problems. Tiny instruments can be used during arthroscopy to repair the torn knee cartilage  (meniscus).   Arthrography is a radiology technique. A contrast liquid is directly injected into the knee joint. Internal structures of the knee joint then become visible on X-ray film.   An MRI scan is a non x-ray radiology procedure in which magnetic fields and a computer produce two- or three-dimensional images of the inside of the knee. Cartilage tears are often visible using an MRI scanner. MRI scans have largely replaced arthrography in diagnosing cartilage tears of the knee.   Blood work.   Examination of the fluid that helps to lubricate the knee joint (synovial fluid). This is done by taking a sample out using a needle and a syringe.  TREATMENT The treatment of knee problems depends on the cause. Some of these treatments are:  Depending on the injury, proper casting, splinting, surgery or physical therapy care will be needed.   Give yourself adequate recovery time. Do not overuse your joints. If you begin to get sore during workout routines, back off. Slow down or do fewer repetitions.   For repetitive activities such as cycling or running, maintain your strength and nutrition.   Alternate muscle groups. For example if you are a weight lifter, work the upper body on one day and the lower body the next.   Either tight or weak muscles do not give the proper support for your knee. Tight or weak muscles do not absorb the stress placed   on the knee joint. Keep the muscles surrounding the knee strong.   Take care of mechanical problems.   If you have flat feet, orthotics or special shoes may help. See your caregiver if you need help.   Arch supports, sometimes with wedges on the inner or outer aspect of the heel, can help. These can shift pressure away from the side of the knee most bothered by osteoarthritis.   A brace called an "unloader" brace also may be used to help ease the pressure on the most arthritic side of the knee.   If your caregiver has prescribed crutches, braces,  wraps or ice, use as directed. The acronym for this is PRICE. This means protection, rest, ice, compression and elevation.   Nonsteroidal anti-inflammatory drugs (NSAID's), can help relieve pain. But if taken immediately after an injury, they may actually increase swelling. Take NSAID's with food in your stomach. Stop them if you develop stomach problems. Do not take these if you have a history of ulcers, stomach pain or bleeding from the bowel. Do not take without your caregiver's approval if you have problems with fluid retention, heart failure, or kidney problems.   For ongoing knee problems, physical therapy may be helpful.   Glucosamine and chondroitin are over-the-counter dietary supplements. Both may help relieve the pain of osteoarthritis in the knee. These medicines are different from the usual anti-inflammatory drugs. Glucosamine may decrease the rate of cartilage destruction.   Injections of a corticosteroid drug into your knee joint may help reduce the symptoms of an arthritis flare-up. They may provide pain relief that lasts a few months. You may have to wait a few months between injections. The injections do have a small increased risk of infection, water retention and elevated blood sugar levels.   Hyaluronic acid injected into damaged joints may ease pain and provide lubrication. These injections may work by reducing inflammation. A series of shots may give relief for as long as 6 months.   Topical painkillers. Applying certain ointments to your skin may help relieve the pain and stiffness of osteoarthritis. Ask your pharmacist for suggestions. Many over the-counter products are approved for temporary relief of arthritis pain.   In some countries, doctors often prescribe topical NSAID's for relief of chronic conditions such as arthritis and tendinitis. A review of treatment with NSAID creams found that they worked as well as oral medications but without the serious side effects.    PREVENTION  Maintain a healthy weight. Extra pounds put more strain on your joints.   Get strong, stay limber. Weak muscles are a common cause of knee injuries. Stretching is important. Include flexibility exercises in your workouts.   Be smart about exercise. If you have osteoarthritis, chronic knee pain or recurring injuries, you may need to change the way you exercise. This does not mean you have to stop being active. If your knees ache after jogging or playing basketball, consider switching to swimming, water aerobics or other low-impact activities, at least for a few days a week. Sometimes limiting high-impact activities will provide relief.   Make sure your shoes fit well. Choose footwear that is right for your sport.   Protect your knees. Use the proper gear for knee-sensitive activities. Use kneepads when playing volleyball or laying carpet. Buckle your seat belt every time you drive. Most shattered kneecaps occur in car accidents.   Rest when you are tired.  SEEK MEDICAL CARE IF:  You have knee pain that is continual and does not   seem to be getting better.  SEEK IMMEDIATE MEDICAL CARE IF:  Your knee joint feels hot to the touch and you have a high fever. MAKE SURE YOU:   Understand these instructions.   Will watch your condition.   Will get help right away if you are not doing well or get worse.  Document Released: 11/14/2006 Document Revised: 01/06/2011 Document Reviewed: 11/14/2006 [employer] Patient Information 2012 [employer]. 

## 2011-06-01 NOTE — ED Provider Notes (Signed)
Medical screening examination/treatment/procedure(s) were performed by non-physician practitioner and as supervising physician I was immediately available for consultation/collaboration.  Dardan Shelton T Kennedey Digilio, MD 06/01/11 1620 

## 2011-06-05 ENCOUNTER — Encounter (HOSPITAL_COMMUNITY): Payer: Self-pay | Admitting: Emergency Medicine

## 2011-06-05 ENCOUNTER — Emergency Department (HOSPITAL_COMMUNITY)
Admission: EM | Admit: 2011-06-05 | Discharge: 2011-06-05 | Disposition: A | Discharge status: Home / Self Care | Payer: Self-pay | Attending: Emergency Medicine | Admitting: Emergency Medicine

## 2011-06-05 DIAGNOSIS — I10 Essential (primary) hypertension: Secondary | ICD-10-CM | POA: Insufficient documentation

## 2011-06-05 DIAGNOSIS — R109 Unspecified abdominal pain: Secondary | ICD-10-CM | POA: Insufficient documentation

## 2011-06-05 DIAGNOSIS — R197 Diarrhea, unspecified: Secondary | ICD-10-CM | POA: Insufficient documentation

## 2011-06-05 DIAGNOSIS — R112 Nausea with vomiting, unspecified: Secondary | ICD-10-CM | POA: Insufficient documentation

## 2011-06-05 DIAGNOSIS — F319 Bipolar disorder, unspecified: Secondary | ICD-10-CM | POA: Insufficient documentation

## 2011-06-05 MED ORDER — PROMETHAZINE HCL 25 MG PO TABS: 25.0000 mg | ORAL_TABLET | Freq: Four times a day (QID) | ORAL | Status: DC | PRN

## 2011-06-05 MED ORDER — ONDANSETRON 4 MG PO TBDP
8.0000 mg | ORAL_TABLET | Freq: Once | ORAL | Status: AC
  Administered 2011-06-05: 8 mg via ORAL
  Filled 2011-06-05: qty 2

## 2011-06-05 MED ORDER — DICYCLOMINE HCL 20 MG PO TABS: 20.0000 mg | ORAL_TABLET | Freq: Four times a day (QID) | ORAL | Status: DC | PRN

## 2011-06-05 MED ORDER — DICYCLOMINE HCL 10 MG/ML IM SOLN
20.0000 mg | INTRAMUSCULAR | Status: AC
  Administered 2011-06-05: 20 mg via INTRAMUSCULAR
  Filled 2011-06-05: qty 2

## 2011-06-05 NOTE — ED Notes (Signed)
C/o upper abd pain, diaphoresis, dizziness, bilateral knee pain and nausea since 4pm today.

## 2011-06-05 NOTE — ED Notes (Signed)
Introduced self to PT and Family. Pt provided with gown, given instructions to dress.

## 2011-06-05 NOTE — ED Provider Notes (Signed)
History     CSN: [id number]  Arrival date & time 06/05/11  2032   First MD Initiated Contact with Patient 06/05/11 2100      Chief Complaint  Patient presents with  . Abdominal Pain    (Consider location/radiation/quality/duration/timing/severity/associated sxs/prior treatment) Patient is a 51 y.o. female presenting with abdominal pain. The history is provided by the patient.  Abdominal Pain The primary symptoms of the illness include abdominal pain, fatigue, nausea, vomiting and diarrhea.  Symptoms associated with the illness do not include frequency.   patient has had abdominal pain sweating dizziness nausea vomiting diarrhea since 4:00 today. She states she has a history of chronic abdominal pain. No fevers. She states she feels weak overall. No blood in stool or emesis. No fevers. She states her knees have been hurting for a while also. She has multiple ER visits for similar symptoms. She states she falls often. She's been to the ER 22 times in the last 6 months.  Past Medical History  Diagnosis Date  . Hypertension   . Bipolar 1 disorder   . Arthritis   . Migraine headache   . Acid reflux     History reviewed. No pertinent past surgical history.  Family History  Problem Relation Age of Onset  . Tuberculosis Mother   . Stroke Mother   . Tuberculosis Father   . Heart disease Father   . Bipolar disorder Sister   . Drug abuse Sister     History  Substance Use Topics  . Smoking status: Never Smoker   . Smokeless tobacco: Never Used  . Alcohol Use: Yes     occ    OB History    Grav Para Term Preterm Abortions TAB SAB Ect Mult Living                  Review of Systems  Constitutional: Positive for fatigue.  HENT: Negative for postnasal drip.   Cardiovascular: Negative for chest pain.  Gastrointestinal: Positive for nausea, vomiting, abdominal pain and diarrhea.  Genitourinary: Negative for frequency.  Musculoskeletal: Positive for myalgias. Negative for  joint swelling.  Skin: Negative for pallor and wound.  Neurological: Positive for dizziness and headaches.  Psychiatric/Behavioral: Negative for decreased concentration.    Allergies  Doxycycline; Flagyl; Penicillins; Shellfish allergy; and Sulfa antibiotics  Home Medications   Current Outpatient Rx  Name Route Sig Dispense Refill  . CLONIDINE HCL 0.1 MG PO TABS Oral Take 0.1 mg by mouth 3 (three) times daily.     Marland Kitchen FLUOXETINE HCL 20 MG PO CAPS Oral Take 20 mg by mouth 2 (two) times daily.    Marland Kitchen LAMOTRIGINE 25 MG PO TABS Oral Take 25 mg by mouth daily.    Marland Kitchen DICYCLOMINE HCL 20 MG PO TABS Oral Take 1 tablet (20 mg total) by mouth every 6 (six) hours as needed. 20 tablet 0  . PROMETHAZINE HCL 25 MG PO TABS Oral Take 1 tablet (25 mg total) by mouth every 6 (six) hours as needed for nausea. 20 tablet 0    BP 113/67  Pulse 88  Temp(Src) 98 F (36.7 C) (Oral)  Resp 19  SpO2 98%  LMP 05/05/2011  Physical Exam  Nursing note and vitals reviewed. Constitutional: She is oriented to person, place, and time. She appears well-developed and well-nourished.  HENT:  Head: Normocephalic and atraumatic.  Eyes: EOM are normal. Pupils are equal, round, and reactive to light.  Neck: Normal range of motion. Neck supple.  Cardiovascular: Normal rate,  regular rhythm and normal heart sounds.   No murmur heard. Pulmonary/Chest: Effort normal and breath sounds normal. No respiratory distress. She has no wheezes. She has no rales.  Abdominal: Soft. Bowel sounds are normal. She exhibits no distension. There is tenderness. There is no rebound and no guarding.       Minimal diffuse abdominal tenderness without rebound or guarding.  Musculoskeletal: Normal range of motion.  Neurological: She is alert and oriented to person, place, and time. No cranial nerve deficit.  Skin: Skin is warm and dry.  Psychiatric: She has a normal mood and affect. Her speech is normal.    ED Course  Procedures (including  critical care time)  Labs Reviewed - No data to display No results found.   1. Abdominal pain   2. Nausea vomiting and diarrhea       MDM  Acute on chronic abdominal pain nausea and diarrhea. Previous workup for same. No clear cause of found in previous workups. Previous lab work is reviewed there is no significant abnormality. Patient is benign exam reassuring vitals. She's tolerated orals here after Bentyl and Zofran. She'll be discharged home to followup with her gastroenterologist as planned.        Juliet Rude. Rubin Payor, MD 06/05/11 2244

## 2011-06-05 NOTE — ED Notes (Signed)
Abdominal pain started around 4 pm today, stabbing pain, nauseated but denies any vomiting. This being going on for few months now. Did not taken ay med at home. Last BM 06/05/11

## 2011-06-05 NOTE — Discharge Instructions (Signed)

## 2011-06-12 ENCOUNTER — Other Ambulatory Visit (HOSPITAL_COMMUNITY): Payer: Self-pay | Admitting: Pharmacy Technician

## 2011-06-12 ENCOUNTER — Emergency Department (HOSPITAL_COMMUNITY)
Admission: EM | Admit: 2011-06-12 | Discharge: 2011-06-13 | Disposition: A | Discharge status: Home / Self Care | Payer: Self-pay | Attending: Emergency Medicine | Admitting: Emergency Medicine

## 2011-06-12 ENCOUNTER — Encounter (HOSPITAL_COMMUNITY): Payer: Self-pay | Admitting: Emergency Medicine

## 2011-06-12 DIAGNOSIS — N898 Other specified noninflammatory disorders of vagina: Secondary | ICD-10-CM | POA: Insufficient documentation

## 2011-06-12 DIAGNOSIS — K219 Gastro-esophageal reflux disease without esophagitis: Secondary | ICD-10-CM | POA: Insufficient documentation

## 2011-06-12 DIAGNOSIS — I10 Essential (primary) hypertension: Secondary | ICD-10-CM | POA: Insufficient documentation

## 2011-06-12 DIAGNOSIS — B373 Candidiasis of vulva and vagina: Secondary | ICD-10-CM

## 2011-06-12 DIAGNOSIS — B3731 Acute candidiasis of vulva and vagina: Secondary | ICD-10-CM | POA: Insufficient documentation

## 2011-06-12 DIAGNOSIS — Z79899 Other long term (current) drug therapy: Secondary | ICD-10-CM | POA: Insufficient documentation

## 2011-06-12 DIAGNOSIS — F319 Bipolar disorder, unspecified: Secondary | ICD-10-CM | POA: Insufficient documentation

## 2011-06-12 DIAGNOSIS — R109 Unspecified abdominal pain: Secondary | ICD-10-CM | POA: Insufficient documentation

## 2011-06-12 DIAGNOSIS — R10819 Abdominal tenderness, unspecified site: Secondary | ICD-10-CM | POA: Insufficient documentation

## 2011-06-12 LAB — WET PREP, GENITAL

## 2011-06-12 MED ORDER — ACETAMINOPHEN 325 MG PO TABS
650.0000 mg | ORAL_TABLET | Freq: Once | ORAL | Status: AC
  Administered 2011-06-13: 650 mg via ORAL
  Filled 2011-06-12: qty 2

## 2011-06-12 MED ORDER — FLUCONAZOLE 100 MG PO TABS
100.0000 mg | ORAL_TABLET | Freq: Once | ORAL | Status: AC
  Administered 2011-06-13: 100 mg via ORAL
  Filled 2011-06-12: qty 1

## 2011-06-12 NOTE — ED Notes (Signed)
Per EMS, pt began with vaginal bleeding this am with genital pain and bumps in genital region.

## 2011-06-12 NOTE — ED Notes (Signed)
Pt alert, nad,  Arrives from home via EMS, c/o bright red vaginal bleeding, onset today, states denies using "pads/hr", resp even unlabored, skin pwd

## 2011-06-12 NOTE — ED Provider Notes (Signed)
History     CSN: [id number]  Arrival date & time 06/12/11  2030   First MD Initiated Contact with Patient 06/12/11 2036      Chief Complaint  Patient presents with  . Vaginal Bleeding    (Consider location/radiation/quality/duration/timing/severity/associated sxs/prior treatment) HPI Comments: Patient here with a day onset of vaginal bleeding and red rash to the vaginal area with pain - also complains of lower abdominal pain which is no worse than her usual chronic lower abdominal pain - she denies dizziness, nausea or vomiting.  Patient is a 51 y.o. female presenting with vaginal bleeding. The history is provided by the patient and the spouse. No language interpreter was used.  Vaginal Bleeding This is a new problem. The current episode started today. The problem occurs constantly. The problem has been unchanged. Associated symptoms include abdominal pain and a rash. Pertinent negatives include no anorexia, arthralgias, change in bowel habit, chest pain, chills, congestion, coughing, diaphoresis, fatigue, fever, headaches, joint swelling, myalgias, nausea, neck pain, numbness, sore throat, swollen glands, urinary symptoms, vertigo, visual change, vomiting or weakness. The symptoms are aggravated by nothing. She has tried nothing for the symptoms. The treatment provided no relief.    Past Medical History  Diagnosis Date  . Hypertension   . Bipolar 1 disorder   . Arthritis   . Migraine headache   . Acid reflux     History reviewed. No pertinent past surgical history.  Family History  Problem Relation Age of Onset  . Tuberculosis Mother   . Stroke Mother   . Tuberculosis Father   . Heart disease Father   . Bipolar disorder Sister   . Drug abuse Sister     History  Substance Use Topics  . Smoking status: Never Smoker   . Smokeless tobacco: Never Used  . Alcohol Use: Yes     occ    OB History    Grav Para Term Preterm Abortions TAB SAB Ect Mult Living                    Review of Systems  Constitutional: Negative for fever, chills, diaphoresis and fatigue.  HENT: Negative for congestion, sore throat and neck pain.   Respiratory: Negative for cough.   Cardiovascular: Negative for chest pain.  Gastrointestinal: Positive for abdominal pain. Negative for nausea, vomiting, anorexia and change in bowel habit.  Genitourinary: Positive for vaginal bleeding.  Musculoskeletal: Negative for myalgias, joint swelling and arthralgias.  Skin: Positive for rash.  Neurological: Negative for vertigo, weakness, numbness and headaches.  All other systems reviewed and are negative.    Allergies  Doxycycline; Flagyl; Penicillins; Shellfish allergy; and Sulfa antibiotics  Home Medications   Current Outpatient Rx  Name Route Sig Dispense Refill  . CLONIDINE HCL 0.1 MG PO TABS Oral Take 0.1 mg by mouth 3 (three) times daily.     Marland Kitchen FLUOXETINE HCL 20 MG PO CAPS Oral Take 20 mg by mouth 2 (two) times daily.    Marland Kitchen LAMOTRIGINE 100 MG PO TABS Oral Take 100 mg by mouth 2 (two) times daily.      BP 131/73  Pulse 95  Temp 98 F (36.7 C)  Resp 16  SpO2 96%  LMP 05/05/2011  Physical Exam  Nursing note and vitals reviewed. Constitutional: She is oriented to person, place, and time. She appears well-developed and well-nourished. No distress.  HENT:  Head: Normocephalic and atraumatic.  Right Ear: External ear normal.  Left Ear: External ear normal.  Nose: Nose normal.  Mouth/Throat: Oropharynx is clear and moist. No oropharyngeal exudate.  Eyes: Conjunctivae are normal. Pupils are equal, round, and reactive to light. No scleral icterus.  Neck: Normal range of motion. Neck supple.  Cardiovascular: Normal rate and regular rhythm.  Exam reveals no gallop and no friction rub.   No murmur heard. Pulmonary/Chest: Effort normal and breath sounds normal. No respiratory distress. She has no wheezes. She has no rales. She exhibits no tenderness.  Abdominal: Soft. Bowel sounds  are normal. She exhibits no distension. There is tenderness.    Genitourinary: There is rash and tenderness on the right labia. There is rash and tenderness on the left labia. Uterus is not tender. Cervix exhibits no motion tenderness, no discharge and no friability. Right adnexum displays no mass and no tenderness. Left adnexum displays no mass and no tenderness. No bleeding around the vagina. No vaginal discharge found.       Patient with excoriated red rash to labia and around anal area - no active bleeding  Musculoskeletal: Normal range of motion. She exhibits no edema and no tenderness.  Lymphadenopathy:    She has no cervical adenopathy.  Neurological: She is alert and oriented to person, place, and time. No cranial nerve deficit.  Skin: Skin is warm and dry. No rash noted. No erythema. No pallor.  Psychiatric: She has a normal mood and affect. Her behavior is normal. Judgment and thought content normal.    ED Course  Procedures (including critical care time)  Labs Reviewed  WET PREP, GENITAL - Abnormal; Notable for the following:    WBC, Wet Prep HPF POC FEW (*)    All other components within normal limits  POCT PREGNANCY, URINE  GC/CHLAMYDIA PROBE AMP, GENITAL   No results found.   Vulvovaginal Yeast    MDM  Patient with a history of chronic lower abdominal pain with this being her 23rd visit for the same thing all with negative work ups, her abdomen is soft and no change in the pain so I do not feel that additional work up is needed.  She does have vulvovaginal candiasis which we will treat.        Izola Price [place], [place] 06/13/11 0006

## 2011-06-12 NOTE — ED Notes (Signed)
[id number]> Expected date:<BR> Expected time: 8:29 PM<BR> Means of arrival:Ambulance<BR> Comments:<BR> M212 -- Vaginal Bleeding (possible infection as well)

## 2011-06-12 NOTE — ED Notes (Signed)
ALP bedside 

## 2011-06-13 LAB — GC/CHLAMYDIA PROBE AMP, GENITAL: Chlamydia, DNA Probe: NEGATIVE

## 2011-06-13 MED ORDER — CLOTRIMAZOLE 1 % VA CREA: 1.0000 | TOPICAL_CREAM | Freq: Every day | VAGINAL | Status: AC

## 2011-06-13 MED ORDER — FLUCONAZOLE 100 MG PO TABS: 100.0000 mg | ORAL_TABLET | Freq: Once | ORAL | Status: AC

## 2011-06-13 NOTE — ED Provider Notes (Signed)
Medical screening examination/treatment/procedure(s) were performed by non-physician practitioner and as supervising physician I was immediately available for consultation/collaboration.   Forbes Cellar, MD 06/13/11 [phone]

## 2011-06-13 NOTE — Discharge Instructions (Signed)
Candida Infection, Adult A candida infection (also called yeast, fungus and Monilia infection) is an overgrowth of yeast that can occur anywhere on the body. A yeast infection commonly occurs in warm, moist body areas. Usually, the infection remains localized but can spread to become a systemic infection. A yeast infection may be a sign of a more severe disease such as diabetes, leukemia, or AIDS. A yeast infection can occur in both men and women. In women, Candida vaginitis is a vaginal infection. It is one of the most common causes of vaginitis. Men usually do not have symptoms or know they have an infection until other problems develop. Men may find out they have a yeast infection because their sex partner has a yeast infection. Uncircumcised men are more likely to get a yeast infection than circumcised men. This is because the uncircumcised glans is not exposed to air and does not remain as dry as that of a circumcised glans. Older adults may develop yeast infections around dentures. CAUSES  Women  Antibiotics.   Steroid medication taken for a long time.   Being overweight (obese).   Diabetes.   Poor immune condition.   Certain serious medical conditions.   Immune suppressive medications for organ transplant patients.   Chemotherapy.   Pregnancy.   Menstration.   Stress and fatigue.   Intravenous drug use.   Oral contraceptives.   Wearing tight-fitting clothes in the crotch area.   Catching it from a sex partner who has a yeast infection.   Spermicide.   Intravenous, urinary, or other catheters.  Men  Catching it from a sex partner who has a yeast infection.   Having oral or anal sex with a person who has the infection.   Spermicide.   Diabetes.   Antibiotics.   Poor immune system.   Medications that suppress the immune system.   Intravenous drug use.   Intravenous, urinary, or other catheters.  SYMPTOMS  Women  Thick, white vaginal discharge.    Vaginal itching.   Redness and swelling in and around the vagina.   Irritation of the lips of the vagina and perineum.   Blisters on the vaginal lips and perineum.   Painful sexual intercourse.   Low blood sugar (hypoglycemia).   Painful urination.   Bladder infections.   Intestinal problems such as constipation, indigestion, bad breath, bloating, increase in gas, diarrhea, or loose stools.  Men  Men may develop intestinal problems such as constipation, indigestion, bad breath, bloating, increase in gas, diarrhea, or loose stools.   Dry, cracked skin on the penis with itching or discomfort.   Jock itch.   Dry, flaky skin.   Athlete's foot.   Hypoglycemia.  DIAGNOSIS  Women  A history and an exam are performed.   The discharge may be examined under a microscope.   A culture may be taken of the discharge.  Men  A history and an exam are performed.   Any discharge from the penis or areas of cracked skin will be looked at under the microscope and cultured.   Stool samples may be cultured.  TREATMENT  Women  Vaginal antifungal suppositories and creams.   Medicated creams to decrease irritation and itching on the outside of the vagina.   Warm compresses to the perineal area to decrease swelling and discomfort.   Oral antifungal medications.   Medicated vaginal suppositories or cream for repeated or recurrent infections.   Wash and dry the irritation areas before applying the cream.     Eating yogurt with lactobacillus may help with prevention and treatment.   Sometimes painting the vagina with gentian violet solution may help if creams and suppositories do not work.  Men  Antifungal creams and oral antifungal medications.   Sometimes treatment must continue for 30 days after the symptoms go away to prevent recurrence.  HOME CARE INSTRUCTIONS  Women  Use cotton underwear and avoid tight-fitting clothing.   Avoid colored, scented toilet paper and  deodorant tampons or pads.   Do not douche.   Keep your diabetes under control.   Finish all the prescribed medications.   Keep your skin clean and dry.   Consume milk or yogurt with lactobacillus active culture regularly. If you get frequent yeast infections and think that is what the infection is, there are over-the-counter medications that you can get. If the infection does not show healing in 3 days, talk to your caregiver.   Tell your sex partner you have a yeast infection. Your partner may need treatment also, especially if your infection does not clear up or recurs.  Men  Keep your skin clean and dry.   Keep your diabetes under control.   Finish all prescribed medications.   Tell your sex partner that you have a yeast infection so they can be treated if necessary.  SEEK MEDICAL CARE IF:   Your symptoms do not clear up or worsen in one week after treatment.   You have an oral temperature above 102 F (38.9 C).   You have trouble swallowing or eating for a prolonged time.   You develop blisters on and around your vagina.   You develop vaginal bleeding and it is not your menstrual period.   You develop abdominal pain.   You develop intestinal problems as mentioned above.   You get weak or lightheaded.   You have painful or increased urination.   You have pain during sexual intercourse.  MAKE SURE YOU:   Understand these instructions.   Will watch your condition.   Will get help right away if you are not doing well or get worse.  Document Released: 02/25/2004 Document Revised: 01/06/2011 Document Reviewed: 06/08/2009 [facility] Patient Information 2012 [place], [place].Candidal Vulvovaginitis Candidal vulvovaginitis is an infection of the vagina and vulva. The vulva is the skin around the opening of the vagina. This may cause itching and discomfort in and around the vagina.  HOME CARE  Only take medicine as told by your doctor.   Do not have sex (intercourse)  until the infection is healed or as told by your doctor.   Practice safe sex.   Tell your sex partner about your infection.   Do not douche or use tampons.   Wear cotton underwear. Do not wear tight pants or panty hose.   Eat yogurt. This may help treat and prevent yeast infections.  GET HELP RIGHT AWAY IF:   You have a fever.   Your problems get worse during treatment or do not get better in 3 days.   You have discomfort, irritation, or itching in your vagina or vulva area.   You have pain after sex.   You start to get belly (abdominal) pain.  MAKE SURE YOU:  Understand these instructions.   Will watch your condition.   Will get help right away if you are not doing well or get worse.  Document Released: 04/15/2008 Document Revised: 01/06/2011 Document Reviewed: 04/15/2008 [facility] Patient Information 2012 [place], [place].

## 2011-09-09 ENCOUNTER — Encounter (HOSPITAL_COMMUNITY): Payer: Self-pay

## 2011-09-09 ENCOUNTER — Emergency Department (HOSPITAL_COMMUNITY)
Admission: EM | Admit: 2011-09-09 | Discharge: 2011-09-09 | Disposition: A | Discharge status: Home / Self Care | Payer: Self-pay | Attending: Emergency Medicine | Admitting: Emergency Medicine

## 2011-09-09 DIAGNOSIS — F319 Bipolar disorder, unspecified: Secondary | ICD-10-CM | POA: Insufficient documentation

## 2011-09-09 DIAGNOSIS — T679XXA Effect of heat and light, unspecified, initial encounter: Secondary | ICD-10-CM | POA: Insufficient documentation

## 2011-09-09 DIAGNOSIS — Z79899 Other long term (current) drug therapy: Secondary | ICD-10-CM | POA: Insufficient documentation

## 2011-09-09 DIAGNOSIS — I10 Essential (primary) hypertension: Secondary | ICD-10-CM | POA: Insufficient documentation

## 2011-09-09 DIAGNOSIS — X30XXXA Exposure to excessive natural heat, initial encounter: Secondary | ICD-10-CM | POA: Insufficient documentation

## 2011-09-09 DIAGNOSIS — K219 Gastro-esophageal reflux disease without esophagitis: Secondary | ICD-10-CM | POA: Insufficient documentation

## 2011-09-09 LAB — COMPREHENSIVE METABOLIC PANEL
ALT: 10 U/L (ref 0–35)
Alkaline Phosphatase: 61 U/L (ref 39–117)
CO2: 26 mEq/L (ref 19–32)
Chloride: 104 mEq/L (ref 96–112)
GFR calc Af Amer: >90 mL/min (ref >90)
GFR calc non Af Amer: >90 mL/min (ref >90)
Glucose, Bld: 94 mg/dL (ref 70–99)
Potassium: 3.7 mEq/L (ref 3.5–5.1)
Sodium: 141 mEq/L (ref 135–145)
Total Bilirubin: 0.6 mg/dL (ref 0.3–1.2)

## 2011-09-09 LAB — URINALYSIS, ROUTINE W REFLEX MICROSCOPIC
Bilirubin Urine: NEGATIVE
Glucose, UA: NEGATIVE mg/dL
Hgb urine dipstick: NEGATIVE
Protein, ur: NEGATIVE mg/dL
Urobilinogen, UA: 0.2 mg/dL (ref 0.0–1.0)

## 2011-09-09 LAB — URINE MICROSCOPIC-ADD ON

## 2011-09-09 LAB — CBC
Hemoglobin: 12.5 g/dL (ref 12.0–15.0)
MCH: 30.5 pg (ref 26.0–34.0)
RBC: 4.1 MIL/uL (ref 3.87–5.11)
WBC: 10.9 10*3/uL — ABNORMAL HIGH (ref 4.0–10.5)

## 2011-09-09 MED ORDER — MORPHINE SULFATE 4 MG/ML IJ SOLN
4.0000 mg | Freq: Once | INTRAMUSCULAR | Status: AC
  Administered 2011-09-09: 4 mg via INTRAVENOUS
  Filled 2011-09-09: qty 1

## 2011-09-09 MED ORDER — ONDANSETRON HCL 4 MG/2ML IJ SOLN
4.0000 mg | Freq: Once | INTRAMUSCULAR | Status: AC
  Administered 2011-09-09: 4 mg via INTRAVENOUS
  Filled 2011-09-09: qty 2

## 2011-09-09 MED ORDER — SODIUM CHLORIDE 0.9 % IV BOLUS (SEPSIS)
1000.0000 mL | Freq: Once | INTRAVENOUS | Status: AC
  Administered 2011-09-09: 1000 mL via INTRAVENOUS

## 2011-09-09 NOTE — ED Notes (Signed)
Pt BIB EMS for exposure to heat after walking around town all day without drinking anything. Pt walked into [employer] to cool down and security guard there called EMS. Diaphoretic. HR was 130 on monitor, BP initially 96/70, received 400 mls of NS and pressure rose to 126/98. 20g to LH.

## 2011-09-09 NOTE — ED Notes (Signed)
Pt denies any questions and reports decrease in pain upon discharge. 

## 2011-09-09 NOTE — ED Provider Notes (Signed)
History     CSN: [id number]  Arrival date & time 09/09/11  1652   First MD Initiated Contact with Patient 09/09/11 1714      Chief Complaint  Patient presents with  . Heat Exposure    (Consider location/radiation/quality/duration/timing/severity/associated sxs/prior treatment) HPI Hx from pt. Alexandra Fitzgerald is a 51 y.o. female with pmh HTN, bipolar disorder presents via EMS for possible heat exposure. She reports that she has been walking around town all day without eating anything today. She apparently went into a business to cool down and the security guard called EMS because she "looked bad." She apparently was diaphoretic on EMS arrival; she was tachycardic to 130 and hypotensive to 96/70; pressure improved with fluids. 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 She denies any chest pain, shortness of breath.   Past Medical History  Diagnosis Date  . Hypertension   . Bipolar 1 disorder   . Arthritis   . Migraine headache   . Acid reflux     History reviewed. No pertinent past surgical history.  Family History  Problem Relation Age of Onset  . Tuberculosis Mother   . Stroke Mother   . Tuberculosis Father   . Heart disease Father   . Bipolar disorder Sister   . Drug abuse Sister     History  Substance Use Topics  . Smoking status: Never Smoker   . Smokeless tobacco: Never Used  . Alcohol Use: Yes     occ    OB History    Grav Para Term Preterm Abortions TAB SAB Ect Mult Living                  Review of Systems  All other systems reviewed and are negative.    Allergies  Doxycycline; Flagyl; Penicillins; Shellfish allergy; and Sulfa antibiotics  Home Medications   Current Outpatient Rx  Name Route Sig Dispense Refill  . FLUOXETINE HCL 20 MG PO CAPS Oral Take 20 mg by mouth 2 (two) times daily.    Marland Kitchen LAMOTRIGINE 100 MG PO  TABS Oral Take 100 mg by mouth 2 (two) times daily.      BP 117/71  Pulse 98  Temp 99.9 F (37.7 C) (Oral)  Resp 17  SpO2 100%  Physical Exam  Nursing note and vitals reviewed. Constitutional: She appears well-developed and well-nourished. No distress.  HENT:  Head: Normocephalic and atraumatic.  Mouth/Throat: Oropharynx is clear and moist. No oropharyngeal exudate.  Eyes:       Normal appearance  Neck: Normal range of motion. Neck supple.  Cardiovascular: Normal rate, regular rhythm and normal heart sounds.   Pulmonary/Chest: Effort normal and breath sounds normal. She has no wheezes. She has no rales. She exhibits no tenderness.  Abdominal: Soft. There is no tenderness. There is no rebound and no guarding.  Musculoskeletal: Normal range of motion. She exhibits no edema and no tenderness.  Neurological: She is alert.  Skin: Skin is warm and dry. She is not diaphoretic.  Psychiatric: She has a normal mood and affect.    ED Course  Procedures (including critical care time)  Date: 09/09/2011  Rate: 84  Rhythm: normal sinus rhythm  QRS Axis: normal  Intervals: normal  ST/T Wave abnormalities: normal  Conduction Disutrbances:none  Narrative Interpretation:   Old EKG Reviewed: none available Labs Reviewed  CBC -  Abnormal; Notable for the following:    WBC 10.9 (*)     All other components within normal limits  URINALYSIS, ROUTINE W REFLEX MICROSCOPIC - Abnormal; Notable for the following:    Color, Urine AMBER (*)  BIOCHEMICALS MAY BE AFFECTED BY COLOR   Specific Gravity, Urine 1.033 (*)     Ketones, ur 15 (*)     Leukocytes, UA TRACE (*)     All other components within normal limits  URINE MICROSCOPIC-ADD ON - Abnormal; Notable for the following:    Crystals CA OXALATE CRYSTALS (*)     All other components within normal limits  COMPREHENSIVE METABOLIC PANEL  CK   No results found.   1. Heat effect       MDM  Patient presents after heat exposure. She was  reportedly swelling on the scene when EMS arrived. She was initially tachycardic and hypotensive but improved with fluids. On my exam, the patient's vital signs were stable. She initially had a slightly elevated oral temperature at 99.9. This was repeated and came down. Vitals have remained stable throughout her stay. Labs including CBC, CK, CMP generally unremarkable. Urinalysis with few ketones. Patient was hydrated while in the department. She has been able to tolerate by mouth fluids. Recommended continued oral hydration at home. Reasons to return discussed.       Grant Fontana, PA-C 09/09/11 1933

## 2011-09-09 NOTE — ED Provider Notes (Signed)
Medical screening examination/treatment/procedure(s) were performed by non-physician practitioner and as supervising physician I was immediately available for consultation/collaboration.   Haylynn Pha H Sharief Wainwright, MD 09/09/11 2326 

## 2012-05-14 ENCOUNTER — Emergency Department: Payer: Self-pay | Admitting: Emergency Medicine

## 2012-07-27 ENCOUNTER — Inpatient Hospital Stay: Payer: Self-pay | Admitting: Psychiatry

## 2012-07-27 LAB — DRUG SCREEN, URINE
Amphetamines, Ur Screen: NEGATIVE (ref ?–1000)
Barbiturates, Ur Screen: NEGATIVE (ref ?–200)
Cocaine Metabolite,Ur ~~LOC~~: NEGATIVE (ref ?–300)
Methadone, Ur Screen: NEGATIVE (ref ?–300)
Opiate, Ur Screen: NEGATIVE (ref ?–300)
Tricyclic, Ur Screen: NEGATIVE (ref ?–1000)

## 2012-07-27 LAB — COMPREHENSIVE METABOLIC PANEL
Alkaline Phosphatase: 70 U/L (ref 50–136)
Calcium, Total: 8.7 mg/dL (ref 8.5–10.1)
Chloride: 107 mmol/L (ref 98–107)
Creatinine: 0.74 mg/dL (ref 0.60–1.30)
EGFR (Non-African Amer.): >60
Glucose: 104 mg/dL — ABNORMAL HIGH (ref 65–99)
Sodium: 139 mmol/L (ref 136–145)
Total Protein: 7.2 g/dL (ref 6.4–8.2)

## 2012-07-27 LAB — CBC
HCT: 37.7 % (ref 35.0–47.0)
HGB: 12.7 g/dL (ref 12.0–16.0)
MCHC: 33.8 g/dL (ref 32.0–36.0)
MCV: 91 fL (ref 80–100)
Platelet: 307 10*3/uL (ref 150–440)
RBC: 4.16 10*6/uL (ref 3.80–5.20)
RDW: 13 % (ref 11.5–14.5)

## 2012-07-27 LAB — ACETAMINOPHEN LEVEL: Acetaminophen: <2 ug/mL

## 2012-07-27 LAB — ETHANOL
Ethanol %: 0.095 % — ABNORMAL HIGH (ref 0.000–0.080)
Ethanol: 95 mg/dL

## 2012-09-29 IMAGING — CT CT CHEST W/ CM
2 of 5 series · 14 of 32 positions shown, 19 images · IV contrast (water/omni  & 100ml omni 300)
Comparison: 03/26/2011

CT CHEST

CLINICAL DATA: Chest pain.  Abdominal pain.

CT CHEST, ABDOMEN AND PELVIS WITH CONTRAST
TECHNIQUE: Multidetector CT imaging of the chest, abdomen and
pelvis was performed following the standard protocol during bolus
administration of intravenous contrast.
Contrast: 100mL OMNIPAQUE IOHEXOL 300 MG/ML IJ SOLN,40mL OMNIPAQUE
IOHEXOL 300 MG/ML IJ SOLN

[Series 400: cor · coronal · 1.04mm/px · 6 of 160 slices shown]
[im 20/160  soft-tissue]
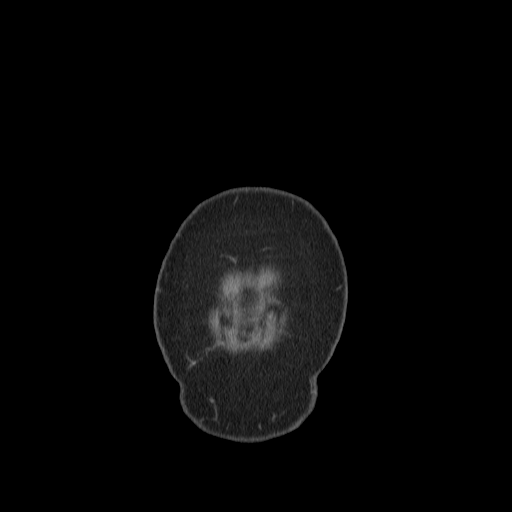
[im 40/160  soft-tissue]
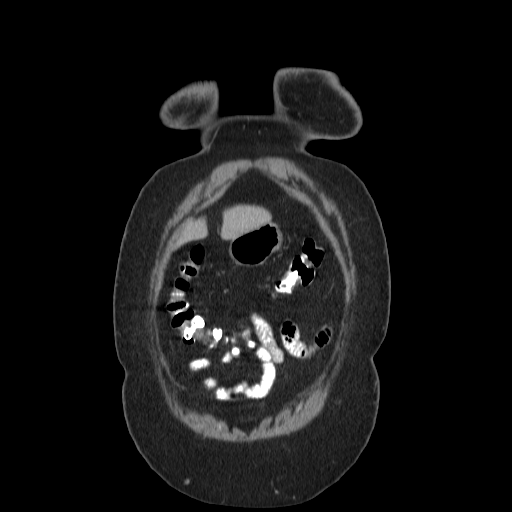
[im 60/160  soft-tissue]
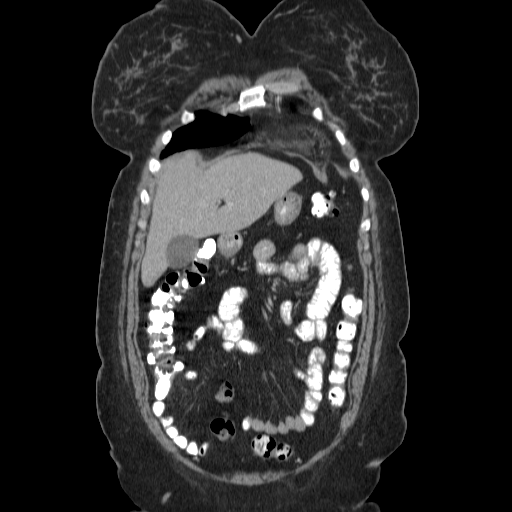
[im 80/160  soft-tissue]
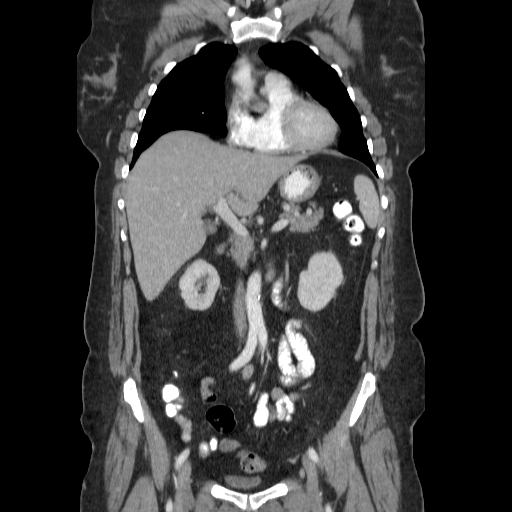
[im 100/160  soft-tissue]
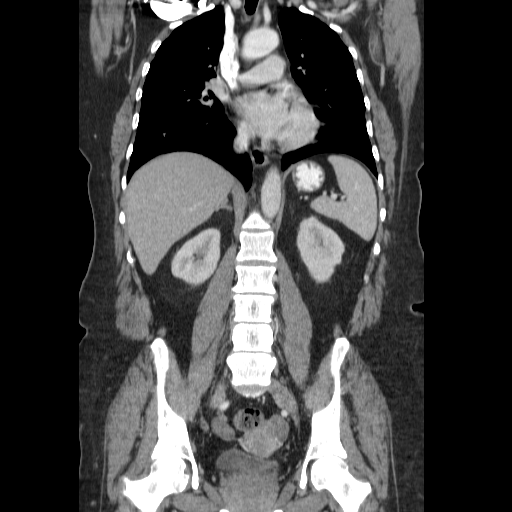
[im 120/160  soft-tissue]
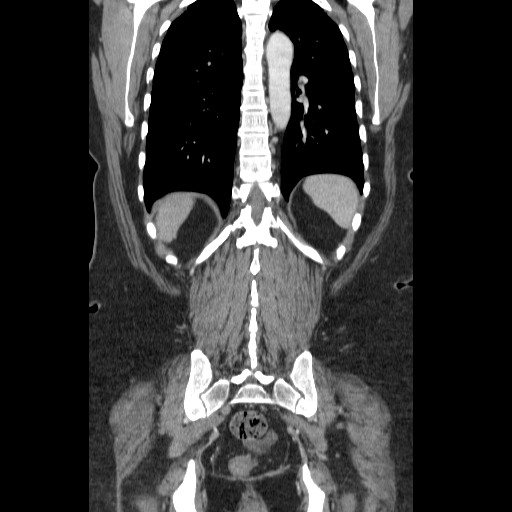

[Series 401: sag · sagittal · 1.04mm/px · 8 of 177 slices shown, 13 images]
[im 20/177  soft-tissue]
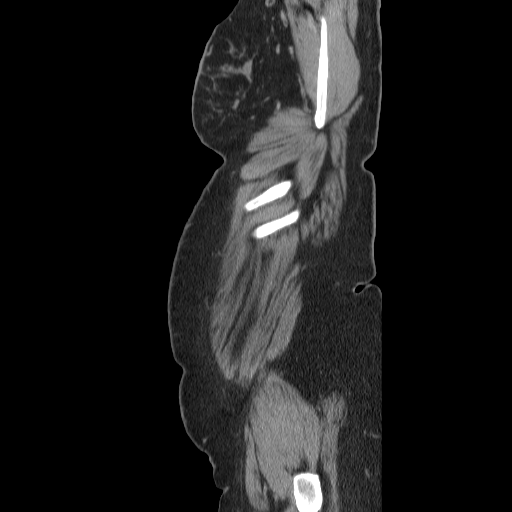
[im 20/177  lung]
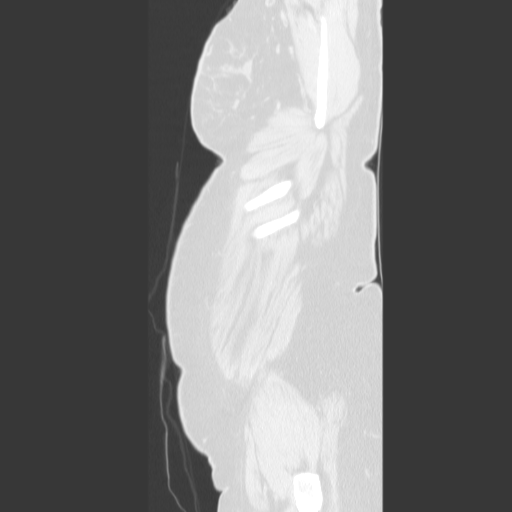
[im 20/177  bone]
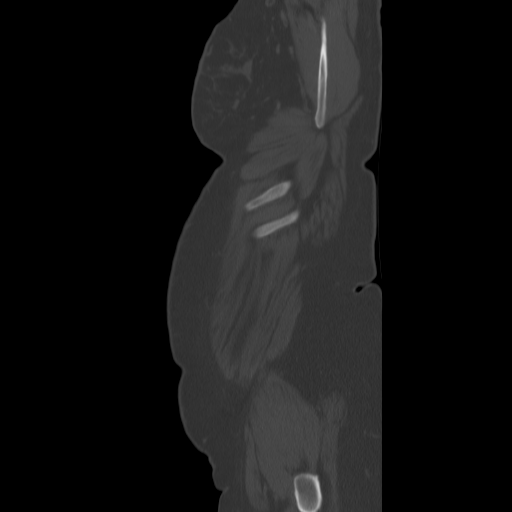
[im 40/177  soft-tissue]
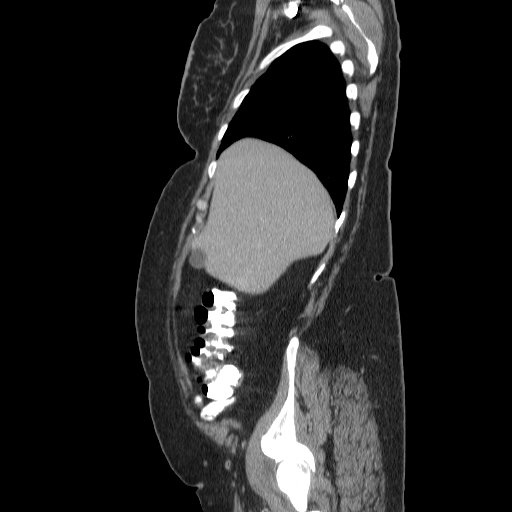
[im 40/177  lung]
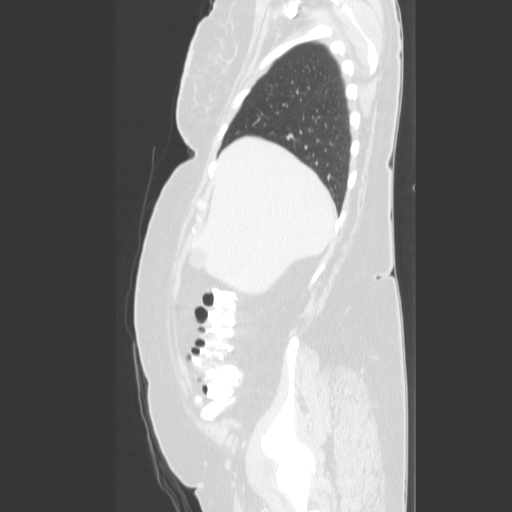
[im 59/177  soft-tissue]
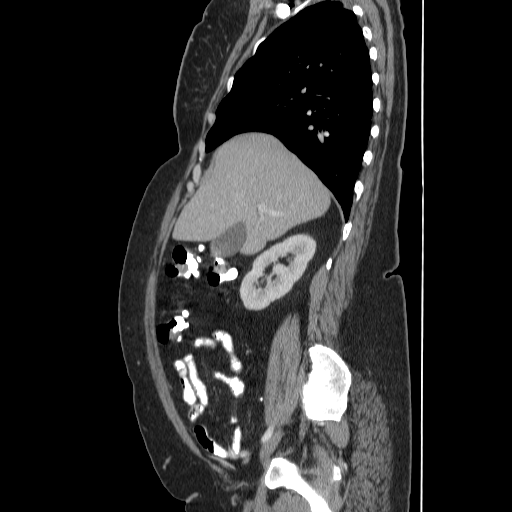
[im 59/177  lung]
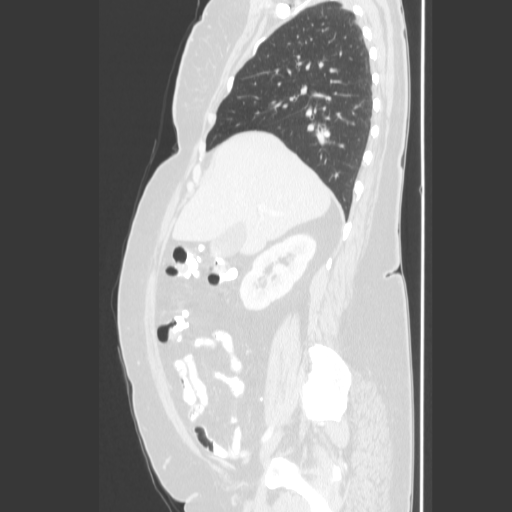
[im 79/177  soft-tissue]
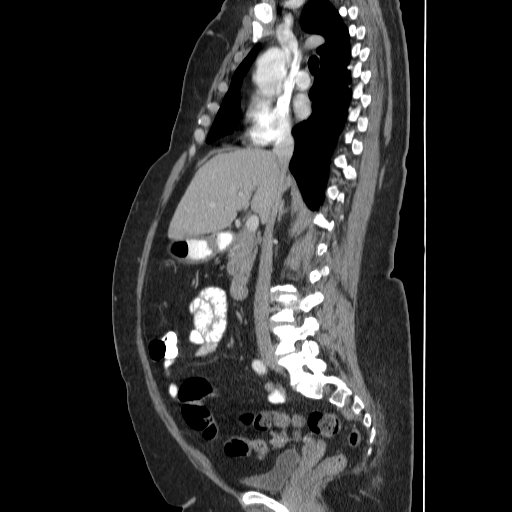
[im 79/177  lung]
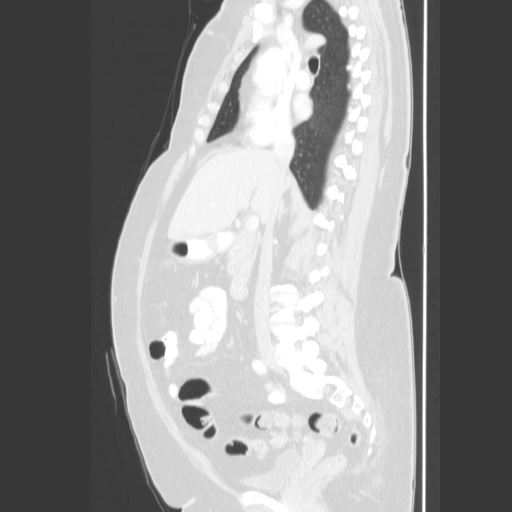
[im 98/177  soft-tissue]
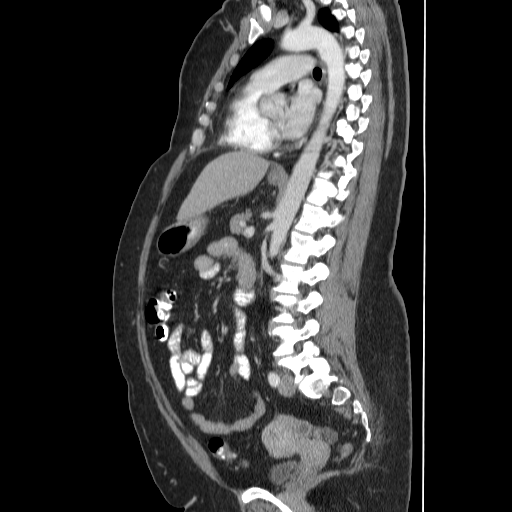
[im 118/177  soft-tissue]
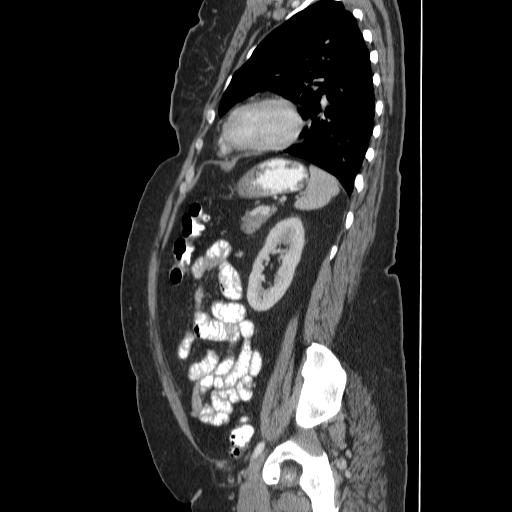
[im 137/177  soft-tissue]
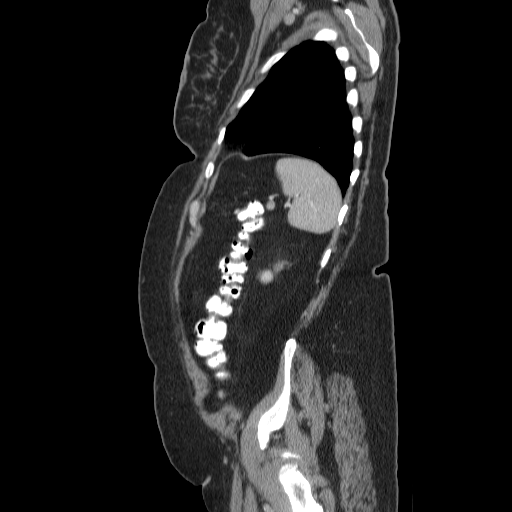
[im 157/177  soft-tissue]
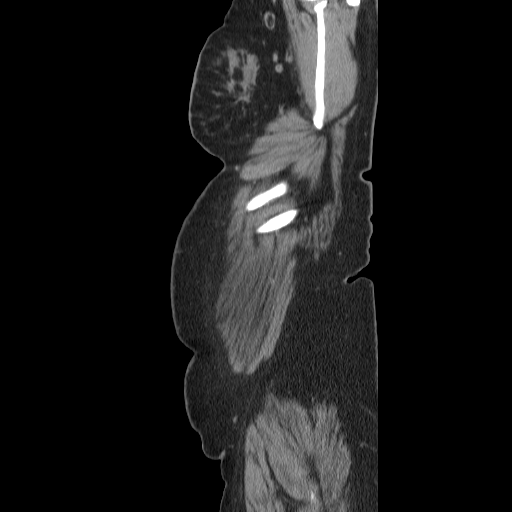

[14 of 32 positions shown; findings below may reference images not displayed]

FINDINGS: No abnormal mediastinal adenopathy.  No pericardial
effusion.

No pneumothorax and no pleural effusion.

Minimal dependent atelectasis in the lungs.  No evidence of
pulmonary nodule.  Prominent osteophytes are present in the left
paraspinal location on image 33 likely accounting for the
radiographic abnormality on the lateral view.

No destructive bone lesion.
IMPRESSION: No evidence of pulmonary nodule.  Prominent osteophytes account for
the radiographic abnormality.

CT ABDOMEN AND PELVIS
FINDINGS: Liver, gallbladder, spleen, gallbladder are within
normal limits.  Stable adrenal glands.  No free fluid.  Normal
appendix.  Cystic changes in the ovaries.  Uterus and bladder are
within normal limits.  Trace free fluid in the pelvis.  Tiny
calculi in the collecting system of the left kidney.
IMPRESSION: Left nephrolithiasis.

No acute findings in the abdomen or pelvis.
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
